# Patient Record
Sex: Female | Born: 1960 | Race: White | Hispanic: No | Marital: Single | Smoking: Never smoker
Health system: Southern US, Community
[De-identification: ages and names within clinical notes are randomized; demographics above are authoritative.]

## PROBLEM LIST (undated history)

## (undated) DIAGNOSIS — E0500 Thyrotoxicosis with diffuse goiter without thyrotoxic crisis or storm: Secondary | ICD-10-CM

## (undated) DIAGNOSIS — D497 Neoplasm of unspecified behavior of endocrine glands and other parts of nervous system: Secondary | ICD-10-CM

## (undated) DIAGNOSIS — D352 Benign neoplasm of pituitary gland: Secondary | ICD-10-CM

## (undated) DIAGNOSIS — G950 Syringomyelia and syringobulbia: Secondary | ICD-10-CM

## (undated) DIAGNOSIS — D72819 Decreased white blood cell count, unspecified: Secondary | ICD-10-CM

## (undated) DIAGNOSIS — K219 Gastro-esophageal reflux disease without esophagitis: Secondary | ICD-10-CM

## (undated) DIAGNOSIS — K589 Irritable bowel syndrome without diarrhea: Secondary | ICD-10-CM

## (undated) DIAGNOSIS — E079 Disorder of thyroid, unspecified: Secondary | ICD-10-CM

## (undated) DIAGNOSIS — M4316 Spondylolisthesis, lumbar region: Secondary | ICD-10-CM

## (undated) DIAGNOSIS — M1990 Unspecified osteoarthritis, unspecified site: Secondary | ICD-10-CM

## (undated) DIAGNOSIS — T7840XA Allergy, unspecified, initial encounter: Secondary | ICD-10-CM

## (undated) DIAGNOSIS — E785 Hyperlipidemia, unspecified: Secondary | ICD-10-CM

## (undated) DIAGNOSIS — L639 Alopecia areata, unspecified: Secondary | ICD-10-CM

## (undated) DIAGNOSIS — N946 Dysmenorrhea, unspecified: Secondary | ICD-10-CM

## (undated) HISTORY — DX: Unspecified osteoarthritis, unspecified site: M19.90

## (undated) HISTORY — DX: Irritable bowel syndrome, unspecified: K58.9

## (undated) HISTORY — DX: Allergy, unspecified, initial encounter: T78.40XA

## (undated) HISTORY — DX: Thyrotoxicosis with diffuse goiter without thyrotoxic crisis or storm: E05.00

## (undated) HISTORY — DX: Alopecia areata, unspecified: L63.9

## (undated) HISTORY — DX: Benign neoplasm of pituitary gland: D35.2

## (undated) HISTORY — DX: Decreased white blood cell count, unspecified: D72.819

## (undated) HISTORY — DX: Syringomyelia and syringobulbia: G95.0

## (undated) HISTORY — DX: Hyperlipidemia, unspecified: E78.5

## (undated) HISTORY — PX: POLYPECTOMY: SHX149

## (undated) HISTORY — DX: Gastro-esophageal reflux disease without esophagitis: K21.9

## (undated) HISTORY — DX: Neoplasm of unspecified behavior of endocrine glands and other parts of nervous system: D49.7

## (undated) HISTORY — DX: Spondylolisthesis, lumbar region: M43.16

## (undated) HISTORY — DX: Dysmenorrhea, unspecified: N94.6

## (undated) HISTORY — DX: Disorder of thyroid, unspecified: E07.9

---
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
## 1988-06-29 DIAGNOSIS — E0500 Thyrotoxicosis with diffuse goiter without thyrotoxic crisis or storm: Secondary | ICD-10-CM

## 1988-06-29 HISTORY — DX: Thyrotoxicosis with diffuse goiter without thyrotoxic crisis or storm: E05.00

## 1997-12-11 ENCOUNTER — Encounter: Admission: RE | Admit: 1997-12-11 | Discharge: 1998-03-11 | Payer: Self-pay | Admitting: Family Medicine

## 2000-01-26 ENCOUNTER — Other Ambulatory Visit: Admission: RE | Admit: 2000-01-26 | Discharge: 2000-01-26 | Payer: Self-pay | Admitting: *Deleted

## 2000-03-19 ENCOUNTER — Encounter: Payer: Self-pay | Admitting: *Deleted

## 2000-03-19 ENCOUNTER — Encounter: Admission: RE | Admit: 2000-03-19 | Discharge: 2000-03-19 | Payer: Self-pay | Admitting: *Deleted

## 2001-01-17 ENCOUNTER — Other Ambulatory Visit: Admission: RE | Admit: 2001-01-17 | Discharge: 2001-01-17 | Payer: Self-pay | Admitting: *Deleted

## 2001-06-29 DIAGNOSIS — G950 Syringomyelia and syringobulbia: Secondary | ICD-10-CM

## 2001-06-29 HISTORY — DX: Syringomyelia and syringobulbia: G95.0

## 2002-01-10 ENCOUNTER — Encounter: Admission: RE | Admit: 2002-01-10 | Discharge: 2002-01-10 | Payer: Self-pay | Admitting: *Deleted

## 2002-01-10 ENCOUNTER — Encounter: Payer: Self-pay | Admitting: *Deleted

## 2002-08-31 ENCOUNTER — Encounter: Admission: RE | Admit: 2002-08-31 | Discharge: 2002-11-22 | Payer: Self-pay | Admitting: Neurosurgery

## 2002-09-06 ENCOUNTER — Other Ambulatory Visit: Admission: RE | Admit: 2002-09-06 | Discharge: 2002-09-06 | Payer: Self-pay | Admitting: *Deleted

## 2003-01-17 ENCOUNTER — Encounter: Admission: RE | Admit: 2003-01-17 | Discharge: 2003-01-17 | Payer: Self-pay | Admitting: *Deleted

## 2003-01-17 ENCOUNTER — Encounter: Payer: Self-pay | Admitting: *Deleted

## 2003-12-05 ENCOUNTER — Other Ambulatory Visit: Admission: RE | Admit: 2003-12-05 | Discharge: 2003-12-05 | Payer: Self-pay | Admitting: *Deleted

## 2004-08-08 ENCOUNTER — Ambulatory Visit (HOSPITAL_COMMUNITY): Admission: RE | Admit: 2004-08-08 | Discharge: 2004-08-08 | Payer: Self-pay | Admitting: *Deleted

## 2004-12-19 ENCOUNTER — Other Ambulatory Visit: Admission: RE | Admit: 2004-12-19 | Discharge: 2004-12-19 | Payer: Self-pay | Admitting: *Deleted

## 2005-12-24 ENCOUNTER — Other Ambulatory Visit: Admission: RE | Admit: 2005-12-24 | Discharge: 2005-12-24 | Payer: Self-pay | Admitting: Obstetrics & Gynecology

## 2006-12-29 ENCOUNTER — Other Ambulatory Visit: Admission: RE | Admit: 2006-12-29 | Discharge: 2006-12-29 | Payer: Self-pay | Admitting: Obstetrics & Gynecology

## 2008-01-12 ENCOUNTER — Other Ambulatory Visit: Admission: RE | Admit: 2008-01-12 | Discharge: 2008-01-12 | Payer: Self-pay | Admitting: Obstetrics & Gynecology

## 2008-01-26 ENCOUNTER — Encounter: Admission: RE | Admit: 2008-01-26 | Discharge: 2008-01-26 | Payer: Self-pay | Admitting: Neurosurgery

## 2009-03-31 LAB — HM PAP SMEAR: HM Pap smear: NORMAL

## 2009-11-12 ENCOUNTER — Encounter: Admission: RE | Admit: 2009-11-12 | Discharge: 2010-01-01 | Payer: Self-pay | Admitting: Family Medicine

## 2010-07-20 ENCOUNTER — Encounter: Payer: Self-pay | Admitting: *Deleted

## 2010-11-28 HISTORY — PX: COLONOSCOPY: SHX174

## 2010-12-12 LAB — HM COLONOSCOPY

## 2010-12-17 ENCOUNTER — Ambulatory Visit (AMBULATORY_SURGERY_CENTER): Payer: BC Managed Care – PPO | Admitting: *Deleted

## 2010-12-17 VITALS — Ht 66.5 in | Wt 141.7 lb

## 2010-12-17 DIAGNOSIS — Z1211 Encounter for screening for malignant neoplasm of colon: Secondary | ICD-10-CM

## 2010-12-17 MED ORDER — PEG-KCL-NACL-NASULF-NA ASC-C 100 G PO SOLR: 1.0000 | Freq: Once | ORAL | Status: DC

## 2010-12-17 NOTE — Patient Instructions (Signed)
Please pick up prep at [facility] Please review discharge instructions

## 2010-12-18 ENCOUNTER — Encounter: Payer: Self-pay | Admitting: Internal Medicine

## 2010-12-22 ENCOUNTER — Encounter: Payer: Self-pay | Admitting: Internal Medicine

## 2010-12-22 ENCOUNTER — Ambulatory Visit (AMBULATORY_SURGERY_CENTER): Payer: BC Managed Care – PPO | Admitting: Internal Medicine

## 2010-12-22 VITALS — BP 146/65 | HR 85 | Temp 98.5°F | Resp 13 | Ht 66.5 in | Wt 140.0 lb

## 2010-12-22 DIAGNOSIS — D126 Benign neoplasm of colon, unspecified: Secondary | ICD-10-CM

## 2010-12-22 DIAGNOSIS — Z1211 Encounter for screening for malignant neoplasm of colon: Secondary | ICD-10-CM

## 2010-12-22 MED ORDER — SODIUM CHLORIDE 0.9 % IV SOLN: 500.0000 mL | INTRAVENOUS | Status: DC

## 2010-12-22 NOTE — Patient Instructions (Signed)
Please refer to blue and green discharge instruction sheets. Be very careful with your safety today due to meds given to you for sedation.

## 2010-12-23 ENCOUNTER — Telehealth: Payer: Self-pay | Admitting: *Deleted

## 2010-12-23 NOTE — Telephone Encounter (Signed)
No answer, no ID on answering machine, no message left.

## 2012-09-27 ENCOUNTER — Telehealth: Payer: Self-pay | Admitting: Obstetrics & Gynecology

## 2012-09-27 NOTE — Telephone Encounter (Signed)
Pt was calling in regards to an issue she is having. She thinks she has a tear in her vaginal area. Going to the bathroom causes a burning sensation. Please call the patient and advise what to do.

## 2012-09-27 NOTE — Telephone Encounter (Signed)
Spoke with pt about small tear. She thinks she may have scratched the area accidentally. With mirror and magnifying glass she can see a small red tear. Reports it burns a little when she urinates, but it does not itch. Advised pt to try Aveeno sitz bath, and to try pouring water over the area after urinating to decrease burning. Pt agreeable and will call back if problem persists.  aa

## 2012-09-27 NOTE — Telephone Encounter (Signed)
LMTCB  aa 

## 2012-12-23 ENCOUNTER — Telehealth: Payer: Self-pay | Admitting: Obstetrics & Gynecology

## 2012-12-23 NOTE — Telephone Encounter (Signed)
Patient in menopause and is bleeding. Patient would like to talk to a nurse.

## 2012-12-23 NOTE — Telephone Encounter (Signed)
Spoke with patient, states had bright red bleeding x once with wiping this morning, then for the next 2-3 hours very light pink with wiping. It has completely stopped now. Appointment made for 12/26/12 with Dr Hyacinth Meeker. Patient aware if bleeding starts again and becomes heavy over the weekend to call our office to get the number for the on call doctor.

## 2012-12-23 NOTE — Telephone Encounter (Signed)
Routed to [employer] to call patient per [employer]

## 2012-12-23 NOTE — Telephone Encounter (Signed)
Needs OV.  

## 2012-12-23 NOTE — Telephone Encounter (Signed)
Pt is concerned that she had some bleeding this morning (bright red) has not had a cycle since Oct or Nov 2012. Wearing a panty liner, has been to the bathroom three times this morning bleeding is now light pink. No new medications, had an appointment yesterday with her PCP(Dr. Tisovec) having horrible headaches was Told to stop taking advil for headaches and to cut back on caffeine. Please advise

## 2012-12-26 ENCOUNTER — Ambulatory Visit (INDEPENDENT_AMBULATORY_CARE_PROVIDER_SITE_OTHER): Payer: BC Managed Care – PPO | Admitting: Obstetrics & Gynecology

## 2012-12-26 VITALS — BP 100/78 | HR 78 | Resp 14 | Ht 66.5 in | Wt 148.0 lb

## 2012-12-26 DIAGNOSIS — N951 Menopausal and female climacteric states: Secondary | ICD-10-CM

## 2012-12-26 DIAGNOSIS — N950 Postmenopausal bleeding: Secondary | ICD-10-CM

## 2012-12-26 DIAGNOSIS — R51 Headache: Secondary | ICD-10-CM

## 2012-12-26 DIAGNOSIS — R519 Headache, unspecified: Secondary | ICD-10-CM

## 2012-12-26 LAB — PROLACTIN: Prolactin: <0.3 ng/mL

## 2012-12-26 LAB — THYROID PANEL WITH TSH
Free Thyroxine Index: 3.1 (ref 1.0–3.9)
T3 Uptake: 34.6 % (ref 22.5–37.0)
T4, Total: 9 ug/dL (ref 5.0–12.5)
TSH: 2.157 u[IU]/mL (ref 0.350–4.500)

## 2012-12-26 NOTE — Patient Instructions (Signed)

## 2012-12-26 NOTE — Progress Notes (Signed)
Subjective:     Patient ID: Darlene Walker, female   DOB: [birth year], 52 y.o.   MRN: [id number]  HPI 52 year old DWF here with PMP bleeding.  Bleeding was Friday, bright red and she noted when she went to void.  Wore a minipad and within 24 hours was gone.  No cramping or breast tenderness before.  No PMS symptoms.    Reports she has seen PCP, Dr. Wylene Walker, several times due to severe headaches.  Several cups of coffee do help.  She is taking Advil several times a day.  Drinking a lot of water.  Nothing has been prescribed.  Also noting a little dizziness.  This is most prominent when she bends over and lifts up head.  No trouble walking.  Has had some neck tension.  She is back in the office and back in front of the computer.    Also noticing increased bruising on legs.    Review of Systems  All other systems reviewed and are negative.      Objective:   Physical Exam  Constitutional: She is oriented to person, place, and time. She appears well-developed and well-nourished.  HENT:  Head: Normocephalic and atraumatic.  Neck: Normal range of motion. Neck supple.  Abdominal: Soft. Bowel sounds are normal.  Genitourinary: Vagina normal and uterus normal.  Neurological: She is alert and oriented to person, place, and time.  Skin: Skin is warm and dry.  Psychiatric: She has a normal mood and affect.      Endometrial biopsy recommended.  Discussed with patient.  Verbal and written consent obtained.   Procedure:  Speculum placed.  Cervix visualized and cleansed with betadine prep.  A single toothed tenaculum was applied to the anterior lip of the cervix.  Milex dilator was used ot dilate endocervical canal.  Endometrial pipelle was advanced through the cervix into the endometrial cavity without difficulty.  Pipelle passed to7cm.  Suction applied and pipelle removed with good tissue sample obtained.  Tenculum removed.  No bleeding noted.  Patient tolerated procedure well.  Assessment:      Postmenopausal bleeding     Plan:     Endometrial biopsy pending TSH, Prolactin If neg, will consider MRI

## 2012-12-27 NOTE — Progress Notes (Signed)
She needs a pelvic and breast exam yearly.  She can have Dr. Wylene Simmer do this one year and me the next.  Can you look and see when her last one was and schedule out one year from last one.

## 2013-01-02 ENCOUNTER — Telehealth: Payer: Self-pay | Admitting: Obstetrics & Gynecology

## 2013-01-02 NOTE — Telephone Encounter (Signed)
Return call from patient.  She states her bottle has refills until 11/2012.  Advised pt that she was given RX for one year on 03/31/12.  She will call her pharmacy to see if they have another RX on file.  She will call us back tomorrow to let us know.

## 2013-01-02 NOTE — Telephone Encounter (Signed)
Refill request for PRISTIQ Last filled by MD on - 03/31/12 X 1 YEAR Last AEX - 03/31/12 Message left on [phone] for patient to return call.

## 2013-01-02 NOTE — Telephone Encounter (Signed)
Patient needs her Pristiq called in for refill  Runs out on Wednesday Patient is at [facility]  [address]  [place]  [phone]

## 2013-01-04 NOTE — Telephone Encounter (Signed)
Pt called back to let Judeth Cornfield know she called the pharmacy and she never turned in the prescription. Pt wants to get enough pills (5) and she will look for the prescription when she gets back from [facility]. Pharmacy: [facility] [phone]

## 2013-01-04 NOTE — Telephone Encounter (Signed)
Please advise if refill OK.  Thank you. 

## 2013-01-04 NOTE — Telephone Encounter (Signed)
Chart please 

## 2013-01-04 NOTE — Telephone Encounter (Signed)
Chart on your counter.

## 2013-01-05 ENCOUNTER — Telehealth: Payer: Self-pay | Admitting: Orthopedic Surgery

## 2013-01-05 MED ORDER — DESVENLAFAXINE SUCCINATE ER 50 MG PO TB24: 50.0000 mg | ORAL_TABLET | Freq: Every day | ORAL | Status: DC

## 2013-01-05 NOTE — Telephone Encounter (Signed)
Spoke with pt who is currently at [facility]. She saw SM last week, but forgot to mention refilling Pristiq while she was here. Her last refill was June 2014. [facility] [address] in [place] is nearby. [phone]. Entire year's refill can be sent, as pt uses [facility] at home too. Chart on your desk.

## 2013-01-05 NOTE — Telephone Encounter (Signed)
Done.  RX already done.

## 2013-01-11 ENCOUNTER — Encounter: Payer: Self-pay | Admitting: *Deleted

## 2013-01-11 NOTE — Telephone Encounter (Signed)
Patient needs to give info to a nurse. Patient said she tried to sent a message through MyChart and got a message stating no one is available to receive the message.

## 2013-01-11 NOTE — Telephone Encounter (Signed)
Left message on CB# of need to return call.

## 2013-01-11 NOTE — Telephone Encounter (Signed)
Patient stated tried to send message to Dr. Hyacinth Meeker from My Chart but would not let her send. Last AEX 03/31/2012 paper chart [facility] 12/26/2012 Patient states she is back from her vacation now . States she had no headaches while gone on vacation. She is now back at work and headaches have come back. Patient calling also to let Dr.Miller know of this morning of Vaginal bleeding noted when wipe with first voided urine this morning. States that she is not 100% sure that this was not rectal bleeding. Stated she did not have a bowel movement this morning when saw a small amount of light pink when wiped. Patient states is wearing a mini pad and has had no bleeding so far butstated again  could have been rectal. Patient states she is OK of waiting to here your response when you get back to office but will call back if any more symptoms or problems noted.

## 2013-01-16 NOTE — Telephone Encounter (Signed)
She had an endometrial biopsy which was negative so unless bleeding continues, it is okay to watch.  If has another episode, she needs to call.    She reports a lot of stress at work.  I felt if the headaches went away on vacation, an MRI is not necessary.  Does she want a referral to [facility]?  These seem very work related.

## 2013-01-16 NOTE — Telephone Encounter (Signed)
Call to patient and notified of Dr Garen Lah response.  Patient to montior bleeding and call back if persists or returns.  States she has noticed that HA seem very positional, worse when up but better when bends over, or when reclined in dental chair.  (reports this was so pronounced at dentist that they actually sat her up and reclined her several times to see how HA responded) Dentist said ? Fluid.  Briefly discussed she could try decongestant for this and see if any improvement, if this is something she can already take and tolerate.  She also reports increase in hot flashes.  She is aware that stress may be playing a role in HA but with increased hot flashes, she feels this is hormone related.  Wants to increase Pristiq back to whole pill.  She states she cut pill in half herself and now would like to go back up, is this OK?  She will plan the higher dose this evening at [facility] unless she hears from [place]. She will wait to see how HA do after her very busy week at work and then decide about referral to [facility].  Please advise on Pristiq.

## 2013-05-04 ENCOUNTER — Other Ambulatory Visit: Payer: Self-pay

## 2013-06-15 LAB — HM MAMMOGRAPHY: HM Mammogram: NORMAL

## 2013-07-27 ENCOUNTER — Encounter: Payer: Self-pay | Admitting: Obstetrics & Gynecology

## 2013-07-28 ENCOUNTER — Ambulatory Visit: Payer: BC Managed Care – PPO | Admitting: Obstetrics & Gynecology

## 2013-07-30 HISTORY — PX: BUNIONECTOMY: SHX129

## 2013-07-31 ENCOUNTER — Encounter: Payer: Self-pay | Admitting: Obstetrics & Gynecology

## 2013-07-31 ENCOUNTER — Ambulatory Visit (INDEPENDENT_AMBULATORY_CARE_PROVIDER_SITE_OTHER): Payer: BC Managed Care – PPO | Admitting: Obstetrics & Gynecology

## 2013-07-31 VITALS — BP 88/58 | HR 68 | Resp 16 | Ht 66.5 in | Wt 152.4 lb

## 2013-07-31 DIAGNOSIS — Z01419 Encounter for gynecological examination (general) (routine) without abnormal findings: Secondary | ICD-10-CM

## 2013-07-31 DIAGNOSIS — D353 Benign neoplasm of craniopharyngeal duct: Secondary | ICD-10-CM

## 2013-07-31 DIAGNOSIS — D352 Benign neoplasm of pituitary gland: Secondary | ICD-10-CM | POA: Insufficient documentation

## 2013-07-31 DIAGNOSIS — Z0184 Encounter for antibody response examination: Secondary | ICD-10-CM

## 2013-07-31 MED ORDER — DESVENLAFAXINE SUCCINATE ER 50 MG PO TB24: 50.0000 mg | ORAL_TABLET | Freq: Every day | ORAL | Status: DC

## 2013-07-31 MED ORDER — GUAIFENESIN-CODEINE 100-10 MG/5ML PO SOLN
ORAL | Status: DC
Start: 2013-07-31 — End: 2014-08-16

## 2013-07-31 NOTE — Progress Notes (Signed)
53 y.o. G0P0 SingleCaucasianF here for annual exam.  Reports starting to have a URI.  Having a lot of congestion and head pressure. Pt in multiple classrooms.  Wonders about Measles immunity.  No vaginal bleeding  Patient's last menstrual period was 03/30/2011.          Sexually active: yes  The current method of family planning is none.    Exercising: no  not regularly Smoker:  no  Health Maintenance: Pap:  03/31/12 WNL/negative HR HPV History of abnormal Pap:  no MMG:  06/15/13 3D normal Colonoscopy:  7/12 repeat in 5 years (Dr. Henrene Pastor) BMD:   2010 scheduled for 9/15 TDaP:  7/08 Screening Labs: PCP, Hb today: PCP, Urine today: PCP   reports that she has never smoked. She has never used smokeless tobacco. She reports that she drinks about 0.5 ounces of alcohol per week. She reports that she does not use illicit drugs.  Past Medical History  Diagnosis Date  . Hyperlipidemia     with High Small Particle LDL  . Allergy   . Arthritis   . Thyroid disease     hx of, no meds now  . IBS (irritable bowel syndrome)   . Graves' disease 1990  . Dysmenorrhea   . Syringomyelia 2003  . Microprolactinoma     Past Surgical History  Procedure Laterality Date  . Tonsillectomy and adenoidectomy  1968  . Colonoscopy  11/2010    Current Outpatient Prescriptions  Medication Sig Dispense Refill  . aspirin 81 MG tablet Take 81 mg by mouth daily.        . cabergoline (DOSTINEX) 0.5 MG tablet 2 (two) times a week.       . desvenlafaxine (PRISTIQ) 50 MG 24 hr tablet Take 1 tablet (50 mg total) by mouth daily.  30 tablet  13  . Loratadine (CLARITIN PO) Take 1 tablet by mouth daily.        . simvastatin (ZOCOR) 40 MG tablet 1 tablet Daily.       Current Facility-Administered Medications  Medication Dose Route Frequency Provider Last Rate Last Dose  . 0.9 %  sodium chloride infusion  500 mL Intravenous Continuous Irene Shipper, MD        Family History  Problem Relation Age of Onset  . Colon  cancer Neg Hx   . Heart disease Mother   . Hypertension Father   . Heart disease Father     ROS:  Pertinent items are noted in HPI.  Otherwise, a comprehensive ROS was negative.  Exam:   BP 88/58  Pulse 68  Resp 16  Ht 5' 6.5" (1.689 m)  Wt 152 lb 6.4 oz (69.128 kg)  BMI 24.23 kg/m2  LMP 03/30/2011  Weight change: +7lbs   Height: 5' 6.5" (168.9 cm)  Ht Readings from Last 3 Encounters:  07/31/13 5' 6.5" (1.689 m)  12/26/12 5' 6.5" (1.689 m)  12/22/10 5' 6.5" (1.689 m)    General appearance: alert, cooperative and appears stated age Head: Normocephalic, without obvious abnormality, atraumatic Neck: no adenopathy, supple, symmetrical, trachea midline and thyroid normal to inspection and palpation Lungs: clear to auscultation bilaterally Breasts: normal appearance, no masses or tenderness Heart: regular rate and rhythm Abdomen: soft, non-tender; bowel sounds normal; no masses,  no organomegaly Extremities: extremities normal, atraumatic, no cyanosis or edema Skin: Skin color, texture, turgor normal. No rashes or lesions Lymph nodes: Cervical, supraclavicular, and axillary nodes normal. No abnormal inguinal nodes palpated Neurologic: Grossly normal   Pelvic:  External genitalia:  no lesions              Urethra:  normal appearing urethra with no masses, tenderness or lesions              Bartholins and Skenes: normal                 Vagina: normal appearing vagina with normal color and discharge, no lesions              Cervix: no lesions              Pap taken: no Bimanual Exam:  Uterus:  normal size, contour, position, consistency, mobility, non-tender              Adnexa: normal adnexa               Rectovaginal: Confirms               Anus:  normal sphincter tone, no lesions  A:  Well Woman with normal exam PMP, no HRT H/O microprolactinoma H/O Grave's Dx H/O colon polyps URI  P:   Mammogram yearly.  D/w pt Shirlee Limerick C dense breast. Dr. Osborne Casco does labs yearly  including yearly prolactin pap smear with neg HR HPV 10/13 RF prestiq 50mg .  Pt is going to stop cutting it in half and see how this works.   Rx for Rubitussin AC 1-2 tsp qhs prn cough given. return annually or prn  An After Visit Summary was printed and given to the patient.

## 2013-07-31 NOTE — Patient Instructions (Signed)

## 2013-08-01 NOTE — Telephone Encounter (Signed)
Order placed for antibody.  Pt to call when wants labs.

## 2013-09-05 ENCOUNTER — Telehealth: Payer: Self-pay

## 2013-09-05 NOTE — Telephone Encounter (Signed)
Message copied by Robley Fries on Tue Sep 05, 2013  9:41 AM ------      Message from: Megan Salon      Created: Sun Sep 03, 2013  2:33 PM      Regarding: lab test       Pt wanted testing for measles antibodies.  Was sick right after AEX and so was going to come in later.  Hasn't come yet.  Please call and see if still desires testing.  If not, I will cancel lab order.  Thanks.            MSM ------

## 2013-09-05 NOTE — Telephone Encounter (Signed)
Patient appointment made for labwork//kn

## 2013-09-05 NOTE — Telephone Encounter (Signed)
Lmtcb//kn 

## 2013-09-07 ENCOUNTER — Other Ambulatory Visit (INDEPENDENT_AMBULATORY_CARE_PROVIDER_SITE_OTHER): Payer: BC Managed Care – PPO

## 2013-09-07 DIAGNOSIS — Z0184 Encounter for antibody response examination: Secondary | ICD-10-CM

## 2013-09-08 LAB — RUBEOLA ANTIBODY IGG: Rubeola IgG: 134 AU/mL — ABNORMAL HIGH (ref <25.00)

## 2014-02-25 ENCOUNTER — Encounter: Payer: Self-pay | Admitting: Obstetrics & Gynecology

## 2014-08-16 ENCOUNTER — Encounter: Payer: Self-pay | Admitting: Obstetrics & Gynecology

## 2014-08-16 ENCOUNTER — Ambulatory Visit (INDEPENDENT_AMBULATORY_CARE_PROVIDER_SITE_OTHER): Payer: BC Managed Care – PPO | Admitting: Obstetrics & Gynecology

## 2014-08-16 VITALS — BP 118/60 | HR 64 | Resp 16 | Ht 66.25 in | Wt 156.4 lb

## 2014-08-16 DIAGNOSIS — Z124 Encounter for screening for malignant neoplasm of cervix: Secondary | ICD-10-CM

## 2014-08-16 DIAGNOSIS — Z01419 Encounter for gynecological examination (general) (routine) without abnormal findings: Secondary | ICD-10-CM

## 2014-08-16 DIAGNOSIS — N393 Stress incontinence (female) (male): Secondary | ICD-10-CM

## 2014-08-16 NOTE — Progress Notes (Signed)
54 y.o. G0P0 SingleCaucasian here for annual exam.  No vaginal bleeding.  Had bunion surgery.  Wearing a boot still.  Surgery was 07/09/14.  Feels like she is doing well.  Reports she is having the typical urinary leakage when she coughs or laughs.  Had a bad GI issue two weeks ago.  Threw up quite a bit.  When this happened, she was having urinary leakage.  This stopped as soon as the emesis stopped.   Patient's last menstrual period was 03/30/2011.          Sexually active: Yes.    The current method of family planning is none.    Exercising: No.  The patient does not participate in regular exercise at present. not regularly Smoker:  no  Health Maintenance: Pap: 03/31/12 WNL/negative HR HPV History of abnormal Pap: no MMG: 07/05/14 3D normal Colonoscopy: 7/12 repeat in 5 years (Dr. Henrene Pastor) BMD: 10/15 at Dr. Loren Racer office TDaP: 7/08 Screening Labs: PCP, Hb today: PCP, Urine today: PCP   reports that she has never smoked. She has never used smokeless tobacco. She reports that she drinks alcohol. She reports that she does not use illicit drugs.  Past Medical History  Diagnosis Date  . Hyperlipidemia     with High Small Particle LDL  . Allergy   . Arthritis   . Thyroid disease     hx of, no meds now  . IBS (irritable bowel syndrome)   . Graves' disease 1990  . Dysmenorrhea   . Syringomyelia 2003  . Microprolactinoma     Past Surgical History  Procedure Laterality Date  . Tonsillectomy and adenoidectomy  1968  . Colonoscopy  11/2010  . Bunionectomy      left foot    Current Outpatient Prescriptions  Medication Sig Dispense Refill  . aspirin 81 MG tablet Take 81 mg by mouth daily.      . cabergoline (DOSTINEX) 0.5 MG tablet 2 (two) times a week.     . desvenlafaxine (PRISTIQ) 50 MG 24 hr tablet Take 1 tablet (50 mg total) by mouth daily. 30 tablet 13  . simvastatin (ZOCOR) 40 MG tablet 1 tablet Daily.     Current Facility-Administered Medications  Medication Dose  Route Frequency Provider Last Rate Last Dose  . 0.9 %  sodium chloride infusion  500 mL Intravenous Continuous Irene Shipper, MD        Family History  Problem Relation Age of Onset  . Colon cancer Neg Hx   . Heart disease Mother   . Hypertension Father   . Heart disease Father     ROS:  Pertinent items are noted in HPI.  Otherwise, a comprehensive ROS was negative.  Exam:   General appearance: alert, cooperative and appears stated age Head: Normocephalic, without obvious abnormality, atraumatic Neck: no adenopathy, supple, symmetrical, trachea midline and thyroid normal to inspection and palpation Lungs: clear to auscultation bilaterally Breasts: normal appearance, no masses or tenderness Heart: regular rate and rhythm Abdomen: soft, non-tender; bowel sounds normal; no masses,  no organomegaly Extremities: extremities normal, atraumatic, no cyanosis or edema Skin: Skin color, texture, turgor normal. No rashes or lesions Lymph nodes: Cervical, supraclavicular, and axillary nodes normal. No abnormal inguinal nodes palpated Neurologic: Grossly normal   Pelvic: External genitalia:  no lesions              Urethra:  normal appearing urethra with no masses, tenderness or lesions  Bartholins and Skenes: normal                 Vagina: normal appearing vagina with normal color and discharge, no lesions              Cervix: no lesions              Pap taken: Yes.   Bimanual Exam:  Uterus:  normal size, contour, position, consistency, mobility, non-tender              Adnexa: normal adnexa and no mass, fullness, tenderness               Rectovaginal: Confirms               Anus:  normal sphincter tone, no lesions  Chaperone was present for exam.  A:  Well Woman with normal exam PMP, no HRT H/O microprolactinoma H/O Grave's Dx H/O colon polyps SUI  P: Mammogram yearly. Pt aware of increased breast density Referral to pelvic PT to see if this will help.  If  doesn't, will recommend additional evaluation Dr. Osborne Casco does labs yearly including yearly prolactin pap smear with neg HR HPV 10/13.  Pap today RF prestiq 50mg . Pt is cutting in 1/2 and will try to wean off further. return annually or prn

## 2014-08-17 ENCOUNTER — Encounter: Payer: Self-pay | Admitting: Obstetrics & Gynecology

## 2014-08-17 MED ORDER — DESVENLAFAXINE SUCCINATE ER 50 MG PO TB24: 50.0000 mg | ORAL_TABLET | Freq: Every day | ORAL | Status: DC

## 2014-08-21 LAB — IPS PAP TEST WITH REFLEX TO HPV

## 2014-08-22 ENCOUNTER — Encounter: Payer: Self-pay | Admitting: Obstetrics & Gynecology

## 2014-08-22 DIAGNOSIS — M25562 Pain in left knee: Secondary | ICD-10-CM

## 2014-08-27 MED ORDER — ESTRADIOL 0.1 MG/GM VA CREA
TOPICAL_CREAM | VAGINAL | Status: DC
Start: 2014-08-27 — End: 2015-05-09

## 2014-08-28 ENCOUNTER — Telehealth: Payer: Self-pay | Admitting: Emergency Medicine

## 2014-08-28 NOTE — Telephone Encounter (Signed)
Patient returned call. Appointment information given. She is agreeable and expresses thanks for referral. Routing to provider for final review. Patient agreeable to disposition. Will close encounter

## 2014-08-28 NOTE — Telephone Encounter (Signed)
Calling patient to advise of appointment with Dr. Lynann Bologna Thursday 3/3 at 0915 with check in at 0900.   [facility] [address] [place], [place] [address]  Phone: [phone]

## 2014-10-11 ENCOUNTER — Encounter: Payer: Self-pay | Admitting: Obstetrics & Gynecology

## 2014-10-11 MED ORDER — DESVENLAFAXINE SUCCINATE ER 50 MG PO TB24: 50.0000 mg | ORAL_TABLET | Freq: Every day | ORAL | Status: DC

## 2014-11-29 ENCOUNTER — Encounter: Payer: Self-pay | Admitting: Obstetrics & Gynecology

## 2014-12-03 ENCOUNTER — Telehealth: Payer: Self-pay | Admitting: Emergency Medicine

## 2014-12-03 NOTE — Telephone Encounter (Signed)
Dr. Sabra Heck can you review and advise, patient sent mychart message.

## 2014-12-03 NOTE — Telephone Encounter (Signed)
Chief Complaint  Patient presents with  . Advice Only    Patient sent mychart message to Dr. Sabra Heck      Dr. Sabra Heck-    Have you heard of [facility] in [place]? Dr. Launa Grill. It is a weight loss program that some friends have tried and have had great success. I am skeptical but want to try!       Here is some of what what the website says:   supervised holistic approach to weight loss Dr. Marianna Payment consults with each client to design an individualized weight loss plan, which includes a low-carb / low-calorie diet, lipotropic injections (vitamins and enzymes - no HCG hormone injections), and an appetite suppressant to help them reach their desired weight. Safety, affordability, convenience and results that last - that's what sets [place] apart.   Includes a health assessment and consultation with a Medical Provider to go over health history and design a program specifically for you, two Lipo-plex injections (to help aid in the breakdown of fat), and a written prescription for an appetite suppressant.      Any advice?    Darlene Walker

## 2014-12-03 NOTE — Telephone Encounter (Signed)
Telephone call for triage created to discuss message with patient and disposition as appropriate.   

## 2014-12-04 NOTE — Telephone Encounter (Signed)
I don't know any specifically about this program.  I am sorry.  Doesn't sound like there is anything that is detrimental and it is physician monitored but I just don't know enough.  Again, I'm sorry.

## 2014-12-05 NOTE — Telephone Encounter (Signed)
Call to patient and message from Dr. Sabra Heck given. Patient agreeable to message. She will call back if any further questions. Routing to provider for final review. Patient agreeable to disposition. Will close encounter.

## 2015-04-19 LAB — IFOBT (OCCULT BLOOD): IFOBT: POSITIVE

## 2015-05-09 ENCOUNTER — Encounter: Payer: Self-pay | Admitting: Physician Assistant

## 2015-05-09 ENCOUNTER — Ambulatory Visit (INDEPENDENT_AMBULATORY_CARE_PROVIDER_SITE_OTHER): Payer: BC Managed Care – PPO | Admitting: Physician Assistant

## 2015-05-09 VITALS — BP 108/72 | HR 80 | Ht 66.0 in | Wt 149.0 lb

## 2015-05-09 DIAGNOSIS — Z860101 Personal history of adenomatous and serrated colon polyps: Secondary | ICD-10-CM | POA: Insufficient documentation

## 2015-05-09 DIAGNOSIS — R195 Other fecal abnormalities: Secondary | ICD-10-CM

## 2015-05-09 DIAGNOSIS — Z86010 Personal history of colonic polyps: Secondary | ICD-10-CM | POA: Diagnosis not present

## 2015-05-09 DIAGNOSIS — D369 Benign neoplasm, unspecified site: Secondary | ICD-10-CM | POA: Diagnosis not present

## 2015-05-09 MED ORDER — PEG-KCL-NACL-NASULF-NA ASC-C 100 G PO SOLR: 1.0000 | Freq: Once | ORAL | Status: DC

## 2015-05-09 NOTE — Progress Notes (Signed)
Agree with assessment and plan 

## 2015-05-09 NOTE — Patient Instructions (Signed)
You have been scheduled for a colonoscopy. Please follow written instructions given to you at your visit today.  Please pick up your prep supplies at the pharmacy within the next 1-3 days. If you use inhalers (even only as needed), please bring them with you on the day of your procedure.   

## 2015-05-09 NOTE — Progress Notes (Signed)
Patient ID: Darlene Walker, female   DOB: 05-11-1961, 54 y.o.   MRN: [id number]   Subjective:    Patient ID: Darlene Walker, female    DOB: 01-14-1961, 54 y.o.   MRN: [id number]  HPI Darlene Walker is a pleasant 54 year old white female known to Dr. Henrene Walker from screening colonoscopy done in June 2012. She was found to have one small polyp which was removed. Path was consistent with a serrated adenoma and she was advised to have 5 year interval follow-up. She is referred today by Dr. Johann Walker back after routine physical was done and patient had a positive  Hemosure . She has no complaints of abdominal discomfort or changes in bowel habits melena or hematochezia. No upper GI symptoms. Family history negative for colon cancer.  Review of Systems Pertinent positive and negative review of systems were noted in the above HPI section.  All other review of systems was otherwise negative.  Outpatient Encounter Prescriptions as of 05/09/2015  Medication Sig  . aspirin 81 MG tablet Take 81 mg by mouth daily.    . cabergoline (DOSTINEX) 0.5 MG tablet 2 (two) times a week.   . desvenlafaxine (PRISTIQ) 50 MG 24 hr tablet Take 1 tablet (50 mg total) by mouth daily.  . simvastatin (ZOCOR) 40 MG tablet 1 tablet Daily.  . peg 3350 powder (MOVIPREP) 100 G SOLR Take 1 kit (200 g total) by mouth once.  . [DISCONTINUED] estradiol (ESTRACE) 0.1 MG/GM vaginal cream 1 gram vaginally twice weekly (Patient not taking: Reported on 05/09/2015)  . [DISCONTINUED] 0.9 %  sodium chloride infusion    No facility-administered encounter medications on file as of 05/09/2015.   No Known Allergies Patient Active Problem List   Diagnosis Date Noted  . Hx of adenomatous polyp of colon 05/09/2015  . 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 Marital Status: Single    Spouse Name: N/A  . Number of Children: 0  . Years of Education: N/A   Occupational History  .  [employer] History  Main Topics  . Smoking status: Never Smoker   . Smokeless tobacco: Never Used  . Alcohol Use: 0.0 oz/week    0 Standard drinks or equivalent per week  . Drug Use: No  . Sexual Activity:    Partners: Male   Other Topics Concern  . Not on file   Social History Narrative    Ms. Arneson's family history includes Heart disease in her father and mother; Hypertension in her father. There is no history of Colon cancer.      Objective:    Filed Vitals:   05/09/15 0836  BP: 108/72  Pulse: 80    Physical Exam    well-developed white female in no acute distress, pleasant blood pressure 108/72 pulse 80 height 5 foot 6 weight 149. HEENT; nontraumatic normocephalic EOMI PERRLA sclera anicteric, Cardiovascular; regular rate and rhythm with S1-S2 no murmur or gallop, Pulmonary ;clear bilaterally, Abdomen; soft nontender nondistended bowel sounds are active there is no palpable mass or hepatosplenomegaly, Rectal; exam not done,Ext;no clubbing cyanosis or edema skin warm and dry, Neuropsych; mood and affect appropriate       Assessment & Plan:   #1 54 yo female with hx of Adenomatous colon polyp 11/2010 presenting with positive Hemosure. R/O occult colon lesion  Plan; Pt will be scheduled for Colonoscopy with Dr. Henrene Walker -procedure discussed in detail with pt and she is agreeable to proceed.  Darlene Bowen S Shadi Larner PA-C 05/09/2015   Cc: Darlene Freeze, MD

## 2015-05-29 ENCOUNTER — Ambulatory Visit (AMBULATORY_SURGERY_CENTER): Payer: BC Managed Care – PPO | Admitting: Internal Medicine

## 2015-05-29 ENCOUNTER — Encounter: Payer: Self-pay | Admitting: Internal Medicine

## 2015-05-29 VITALS — BP 98/59 | HR 119 | Temp 97.5°F | Resp 19 | Ht 66.0 in | Wt 149.0 lb

## 2015-05-29 DIAGNOSIS — D122 Benign neoplasm of ascending colon: Secondary | ICD-10-CM

## 2015-05-29 DIAGNOSIS — Z86010 Personal history of colonic polyps: Secondary | ICD-10-CM | POA: Diagnosis not present

## 2015-05-29 DIAGNOSIS — R195 Other fecal abnormalities: Secondary | ICD-10-CM

## 2015-05-29 MED ORDER — SODIUM CHLORIDE 0.9 % IV SOLN: 500.0000 mL | INTRAVENOUS | Status: DC

## 2015-05-29 NOTE — Progress Notes (Signed)
Called to room to assist during endoscopic procedure.  Patient ID and intended procedure confirmed with present staff. Received instructions for my participation in the procedure from the performing physician.  

## 2015-05-29 NOTE — Progress Notes (Signed)
Patient awakening,vss,report to rn 

## 2015-05-29 NOTE — Patient Instructions (Signed)

## 2015-05-29 NOTE — Op Note (Signed)
[place]  [address]. [place], [id number]   COLONOSCOPY PROCEDURE REPORT  PATIENT: Darlene Walker, Darlene Walker  MR#: [id number] BIRTHDATE: Dec 09, 1960 , 19  yrs. old GENDER: female ENDOSCOPIST: Eustace Quail, MD REFERRED [id number] Tisovec, M.D. PROCEDURE DATE:  05/29/2015 PROCEDURE:   Colonoscopy with snare polypectomy x 1  ASA CLASS:   Class II INDICATIONS:heme-positive stool. .index colonoscopy June 2012 with sessile serrated polyp MEDICATIONS: Propofol 400 mg IV and Monitored anesthesia care  DESCRIPTION OF PROCEDURE:   After the risks benefits and alternatives of the procedure were thoroughly explained, informed consent was obtained.  The digital rectal exam revealed no abnormalities of the rectum.   The LB [id number]  endoscope was introduced through the anus and advanced to the cecum, which was identified by both the appendix and ileocecal valve. No adverse events experienced.   The quality of the prep was excellent. (MoviPrep was used)  The instrument was then slowly withdrawn as the colon was fully examined. Estimated blood loss is zero unless otherwise noted in this procedure report.      COLON FINDINGS: A sessile polyp measuring 5 mm in size was found in the ascending colon.  A polypectomy was performed with a cold snare.  The resection was complete, the polyp tissue was completely retrieved and sent to histology.   The examination was otherwise normal.  Retroflexed views revealed no abnormalities. The time to cecum = 3.7 Withdrawal time = 10.7   The scope was withdrawn and the procedure completed. COMPLICATIONS: There were no immediate complications.  ENDOSCOPIC IMPRESSION: 1.   Sessile polyp was found in the ascending colon; polypectomy was performed with a cold snare 2.   The examination was otherwise normal  RECOMMENDATIONS: 1. Follow up colonoscopy in 5 years 2. I do not recommend annual Hemoccult studies for colon cancer screening  as this patient is in active surveillance program  eSigned:  Eustace Quail, MD 05/29/2015 2:08 PM   cc: The Patient and Domenick Gong, MD

## 2015-05-30 ENCOUNTER — Telehealth: Payer: Self-pay | Admitting: *Deleted

## 2015-05-30 NOTE — Telephone Encounter (Signed)
  Follow up Call-  Call back number 05/29/2015  Post procedure Call Back phone  # [phone]  Permission to leave phone message Yes     Patient questions:  Do you have a fever, pain , or abdominal swelling? No. Pain Score  0   Have you tolerated food without any problems? Yes.    Have you been able to return to your normal activities? Yes.    Do you have any questions about your discharge instructions: Diet   No. Medications  No. Follow up visit  No.  Do you have questions or concerns about your Care? No.  Actions: * If pain score is 4 or above: No action needed, pain <4.

## 2015-06-04 ENCOUNTER — Encounter: Payer: Self-pay | Admitting: Internal Medicine

## 2015-10-18 ENCOUNTER — Ambulatory Visit: Payer: BC Managed Care – PPO | Admitting: Obstetrics & Gynecology

## 2015-10-21 ENCOUNTER — Other Ambulatory Visit: Payer: Self-pay | Admitting: Obstetrics & Gynecology

## 2015-10-21 NOTE — Telephone Encounter (Signed)
Medication refill request: Pristiq  Last AEX:  08-16-14 Next AEX: 12-02-15 Last MMG (if hormonal medication request): 08-30-15 WNl Refill authorized: please advise   Sending to BS since SM is out of the office

## 2015-12-02 ENCOUNTER — Encounter: Payer: Self-pay | Admitting: Obstetrics & Gynecology

## 2015-12-02 ENCOUNTER — Ambulatory Visit (INDEPENDENT_AMBULATORY_CARE_PROVIDER_SITE_OTHER): Payer: BC Managed Care – PPO | Admitting: Obstetrics & Gynecology

## 2015-12-02 VITALS — BP 94/60 | HR 84 | Resp 16 | Ht 66.0 in | Wt 160.0 lb

## 2015-12-02 DIAGNOSIS — Z205 Contact with and (suspected) exposure to viral hepatitis: Secondary | ICD-10-CM

## 2015-12-02 DIAGNOSIS — Z01419 Encounter for gynecological examination (general) (routine) without abnormal findings: Secondary | ICD-10-CM

## 2015-12-02 LAB — HEPATITIS C ANTIBODY: HCV Ab: NEGATIVE

## 2015-12-02 MED ORDER — DESVENLAFAXINE SUCCINATE ER 50 MG PO TB24: 50.0000 mg | ORAL_TABLET | Freq: Every day | ORAL | Status: DC

## 2015-12-02 NOTE — Progress Notes (Signed)
55 y.o. G0P0 SingleCaucasianF here for annual exam.  Doing well except she is very frustrated with her weight.  D/W pt options for weight loss programs.    Denies vaginal bleeding.    Patient's last menstrual period was 03/30/2011.          Sexually active: No.  The current method of family planning is post menopausal status.    Exercising: No.  The patient does not participate in regular exercise at present. Smoker:  no  Health Maintenance: Pap:  08/16/14 Neg. 03/31/12 Neg. HR HPV:neg History of abnormal Pap:  no MMG:  08/30/15 BIRADS1:neg Colonoscopy:  05/29/15 Polyps - repeat 5 years.   BMD:   03/2014 Dr. Wylene Simmerisovec  TDaP:  12/2006  Pneumonia vaccine(s):  No Zostavax: ~2015 Hep C testing: No Screening Labs: PCP, Urine today: PCP   reports that she has never smoked. She has never used smokeless tobacco. She reports that she drinks alcohol. She reports that she does not use illicit drugs.  Past Medical History  Diagnosis Date  . Hyperlipidemia     with High Small Particle LDL  . Allergy   . Arthritis   . Thyroid disease     hx of, no meds now  . IBS (irritable bowel syndrome)   . Graves' disease 1990  . Dysmenorrhea   . Syringomyelia ([facility]) 2003  . Microprolactinoma ([facility])   . Leukopenia   . Alopecia areata   . Spondylolisthesis of lumbar region     Past Surgical History  Procedure Laterality Date  . Tonsillectomy and adenoidectomy  1968  . Colonoscopy  11/2010  . Bunionectomy  07/2013    left foot    Current Outpatient Prescriptions  Medication Sig Dispense Refill  . aspirin 81 MG tablet Take 81 mg by mouth daily.      . Biotin 10 MG CAPS Take by mouth daily.    . cabergoline (DOSTINEX) 0.5 MG tablet 2 (two) times a week.     . cetirizine (ZYRTEC) 10 MG tablet Take 10 mg by mouth daily.    . cholecalciferol (VITAMIN D) 1000 units tablet Take 1,000 Units by mouth daily.    . Esomeprazole Magnesium (NEXIUM) 2.5 MG PACK Take by mouth daily.    . Lactobacillus (PROBIOTIC  ACIDOPHILUS PO) Take by mouth daily.    Marland Kitchen. PRISTIQ 50 MG 24 hr tablet TAKE 1 TABLET(50 MG) BY MOUTH DAILY 30 tablet 1  . simvastatin (ZOCOR) 40 MG tablet 1 tablet Daily.     No current facility-administered medications for this visit.    Family History  Problem Relation Age of Onset  . Colon cancer Neg Hx   . Heart disease Mother   . Hypertension Father   . Heart disease Father     ROS:  Pertinent items are noted in HPI.  Otherwise, a comprehensive ROS was negative.  Exam:   BP 94/60 mmHg  Pulse 84  Resp 16  Ht 5\' 6"  (1.676 m)  Wt 160 lb (72.576 kg)  BMI 25.84 kg/m2  LMP 03/30/2011  Weight change: +4#   Height: 5\' 6"  (167.6 cm)  Ht Readings from Last 3 Encounters:  12/02/15 5\' 6"  (1.676 m)  05/29/15 5\' 6"  (1.676 m)  05/09/15 5\' 6"  (1.676 m)    General appearance: alert, cooperative and appears stated age Head: Normocephalic, without obvious abnormality, atraumatic Neck: no adenopathy, supple, symmetrical, trachea midline and thyroid normal to inspection and palpation Lungs: clear to auscultation bilaterally Breasts: normal appearance, no masses or tenderness  Heart: regular rate and rhythm Abdomen: soft, non-tender; bowel sounds normal; no masses,  no organomegaly Extremities: extremities normal, atraumatic, no cyanosis or edema Skin: Skin color, texture, turgor normal. No rashes or lesions Lymph nodes: Cervical, supraclavicular, and axillary nodes normal. No abnormal inguinal nodes palpated Neurologic: Grossly normal   Pelvic: External genitalia:  no lesions              Urethra:  normal appearing urethra with no masses, tenderness or lesions              Bartholins and Skenes: normal                 Vagina: normal appearing vagina with normal color and discharge, no lesions              Cervix: no lesions              Pap taken: No. Bimanual Exam:  Uterus:  normal size, contour, position, consistency, mobility, non-tender              Adnexa: normal adnexa and no  mass, fullness, tenderness               Rectovaginal: Confirms               Anus:  normal sphincter tone, no lesions  Chaperone was present for exam.  A:  Well Woman with normal exam PMP, no HRT H/O microprolactinoma H/O Grave's Dx H/O colon polyps  P: Mammogram yearly. Pt aware of increased breast density Yearly blood work and vaccines done by Dr. Osborne Casco.  He also tests prolactin yearly. pap smear with neg HR HPV 10/13. pap neg 2016.  No pap smear today. RF prestiq 50mg . #30/13 RF Hep C antibody testing today return annually or prn

## 2015-12-30 ENCOUNTER — Other Ambulatory Visit: Payer: Self-pay | Admitting: Obstetrics and Gynecology

## 2016-06-04 ENCOUNTER — Encounter: Payer: Self-pay | Admitting: Obstetrics & Gynecology

## 2016-06-04 ENCOUNTER — Telehealth: Payer: Self-pay | Admitting: *Deleted

## 2016-06-04 NOTE — Telephone Encounter (Signed)
Dr. Sabra Heck, see MyChart message below and advise?     From Osa Craver To Megan Salon, MD Sent 06/04/2016 11:24 AM  I was out of town for a week and forgot my Pristiq. After 3 days of feeling strange, I realized what I was missing and the pharmacist in [place] filled it for me but they only had the generic.  So I have taken the generic for 7 days now. I am still not quite feeling "right". A little off balance and having lots of short hot flashes (which I was not having before). Trouble sleeping.No energy .  Do you think any of this is due to the generic? Advice?  Thank you!  Darlene Walker  03/11/1961  [phone]

## 2016-06-05 NOTE — Telephone Encounter (Signed)
I already replied to this and advised to go back to the branded rx.  Thanks.

## 2016-06-10 ENCOUNTER — Telehealth: Payer: Self-pay

## 2016-06-10 MED ORDER — PRISTIQ 50 MG PO TB24: 50.0000 mg | ORAL_TABLET | Freq: Every day | ORAL | 6 refills | Status: DC

## 2016-06-10 NOTE — Telephone Encounter (Signed)
Thank you for update.  Ok to close encounter.

## 2016-06-10 NOTE — Telephone Encounter (Signed)
Please see telephone encounter dated 06/10/2016.

## 2016-06-10 NOTE — Telephone Encounter (Signed)
Any way you could call the pharmacy? They are saying it is too soon to refill and insurance won't pay.....  Crazy to me since the pharmacy did not have the Pristiq and substituted this. Maybe even write a new prescription for a partial dose?   [facility] on [address]  ----- Message -----  From: Lyman Speller, MD  Sent: 06/05/2016 12:43 PM EST  To: Darlene Walker  Subject: RE: Non-Urgent Medical Question  This sounds related because it's the only think that has changed. I would switch back to the branded if you can. Let me know how I can help.    Edwinna Areola, MD    ----- Message -----   From: Darlene Walker   Sent: 06/04/2016 11:24 AM EST    To: Lyman Speller, MD  Subject: Non-Urgent Medical Question    I was out of town for a week and forgot my Pristiq. After 3 days of feeling strange, I realized what I was missing and the pharmacist in [place] filled it for me but they only had the generic.   So I have taken the generic for 7 days now. I am still not quite feeling "right". A little off balance and having lots of short hot flashes (which I was not having before). Trouble sleeping.No energy .   Do you think any of this is due to the generic? Advice?  Thank you!  Darlene Walker  04/06/1961  [phone]   Sent in new Rx for brand name only Pristiq 50 mg daily #30 6RF to pharmacy on file. Advised patient new rx has been sent. Advised as this rx is DAW she may be able to pick it up early. If not may have to pay OOP for a partial dose until next refill is due. Aware if she needs any further assistance or PA through insurance to contact our office. Patient is agreeable.  Dr.Miller, agree with plan?

## 2016-07-01 ENCOUNTER — Encounter: Payer: Self-pay | Admitting: Obstetrics & Gynecology

## 2016-07-02 ENCOUNTER — Telehealth: Payer: Self-pay | Admitting: Obstetrics & Gynecology

## 2016-07-02 MED ORDER — DESVENLAFAXINE SUCCINATE ER 50 MG PO TB24: 50.0000 mg | ORAL_TABLET | Freq: Every day | ORAL | 6 refills | Status: DC

## 2016-07-02 NOTE — Telephone Encounter (Signed)
Rx was sent to pharmacy on file.  

## 2016-07-02 NOTE — Telephone Encounter (Signed)
Dr. Sabra Heck, see MyChart message below. Ok to fill pristiq as generic?  From Osa Craver To Megan Salon, MD Sent 07/01/2016 10:18 PM  I am writing in response to the Pristiq issue AGAIN-sorry!   Insurance still would not cover the [place], even when you sent the order for Pristiq only. I have adjusted to the generic , although I find that it is not as helpful with the hotflashes.   Now.Marland KitchenMarland KitchenBlue Cross has now decided that Pristiq is a non-preferred drug so my part would be $383 a month-can't do it.   Now [facility] will not fill the generic b/c of the last order for Pristiq only. So.Marland KitchenMarland KitchenI need you to call [facility] back , please, and ok the prescription to be either generic OR Pristiq (in case they overturn) .   I am totally out so I need to pick this up ASAP on Thursday. [place] so much!   Alan Ripper  [phone]

## 2016-07-02 NOTE — Telephone Encounter (Signed)
Patient sent Dr Sabra Heck a MyChart message about a medication and wants to confirm that she received the message.

## 2016-07-02 NOTE — Telephone Encounter (Signed)
Spoke with patient. Advised spoke with [facility], ok to fill as generic. Patient states she just can't afford the brand Pristiq and would rather use the generic than pay the out of pocket cost. Advised patient will return call if any additional recommendations from Dr. Sabra Heck, patient is agreeable.  Dr. Sabra Heck, any additional recommendations?

## 2016-07-02 NOTE — Telephone Encounter (Signed)
We can always try to do a prior authorization for the branded and see if we can get it approved.  Just a thought.

## 2016-07-02 NOTE — Telephone Encounter (Signed)
Spoke with Truman Hayward at [facility]. Advised ok to fill Pristiq as generic.

## 2016-07-03 ENCOUNTER — Other Ambulatory Visit: Payer: Self-pay | Admitting: *Deleted

## 2016-08-06 ENCOUNTER — Telehealth: Payer: Self-pay

## 2016-08-06 NOTE — Telephone Encounter (Signed)
Spoke with patient. Advised patient PA was submitted to her insurance for coverage of brand name only Pristiq. Received letter from [place] which states this has been denied because her pharmacy benefit plan has a front end deductible that must be met. Patient states she received letter as well and has spoken with her insurance. Verbalizes understanding. Will stay on generic at this time due to cost. Form from insurance to scan.  Routing to provider for final review. Patient agreeable to disposition. Will close encounter.

## 2016-08-06 NOTE — Telephone Encounter (Signed)
See telephone encounter dated 08/06/16- follow-up with PA.

## 2016-09-24 ENCOUNTER — Encounter: Payer: Self-pay | Admitting: Obstetrics & Gynecology

## 2017-03-10 NOTE — Progress Notes (Signed)
56 y.o. G0P0 Single Caucasian F here for annual exam.  Reports she is having a lot of fatigue and naps once or twice a day.  Working really hard but feels very fatigued in the evening.  Just saw Dr. Osborne Casco recently and had a lot of blood work done.  Has been experiencing some chest "clutching" and discomfort.  She realized she wasn't on Dexilant and restarted this.  These symptoms have resolved.    She states her hot flashes have come back.  She is not having these at night.  States she just feels a little "off" right now.  Taking Prestiq and has added [place] but decided to stop the [place].    Denies vaginal bleeding.   Patient's last menstrual period was 03/30/2011.          Sexually active: No.  The current method of family planning is post menopausal status.    Exercising: No.  The patient does not participate in regular exercise at present. Smoker:  no  Health Maintenance: Pap:  08/16/14 negative, 03/31/12 negative, HR HPV negative  History of abnormal Pap:  no MMG:  08/31/16 BIRADS 1 negative  Colonoscopy: 05/29/15 Dr. Henrene Pastor.  Follow up 5 years. BMD:   03/2014 with Dr. Osborne Casco  TDaP:  12/28/06  Pneumonia vaccine(s):  never Zostavax: 2016  Hep C testing: 12/02/15 negative  Screening Labs: PCP takes care of labs   reports that she has never smoked. She has never used smokeless tobacco. She reports that she drinks alcohol. She reports that she does not use drugs.  Past Medical History:  Diagnosis Date  . Allergy   . Alopecia areata   . Arthritis   . Dysmenorrhea   . Graves' disease 1990  . Hyperlipidemia    with High Small Particle LDL  . IBS (irritable bowel syndrome)   . Leukopenia   . 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 Spondylolisthesis of lumbar region   . Syringomyelia ([place]) 2003  . Thyroid disease    hx of, no meds now    Past Surgical History:  Procedure Laterality Date  . BUNIONECTOMY  07/2013   left foot  . COLONOSCOPY  11/2010  . TONSILLECTOMY AND ADENOIDECTOMY  1968     Current Outpatient Prescriptions  Medication Sig Dispense Refill  . aspirin 81 MG tablet Take 81 mg by mouth daily.      . Biotin 10 MG CAPS Take by mouth daily.    . cabergoline (DOSTINEX) 0.5 MG tablet 2 (two) times a week.     . cetirizine (ZYRTEC) 10 MG tablet Take 10 mg by mouth daily.    Marland Kitchen desvenlafaxine (PRISTIQ) 50 MG 24 hr tablet Take 1 tablet (50 mg total) by mouth daily. 30 tablet 6  . DEXILANT 60 MG capsule Take 1 capsule by mouth daily.  4  . simvastatin (ZOCOR) 40 MG tablet 1 tablet Daily.    . cholecalciferol (VITAMIN D) 1000 units tablet Take 1,000 Units by mouth daily.    . Lactobacillus (PROBIOTIC ACIDOPHILUS PO) Take by mouth daily.     No current facility-administered medications for this visit.     Family History  Problem Relation Age of Onset  . Hypertension Father   . Heart disease Father   . Heart disease Mother   . Colon cancer Neg Hx     ROS:  Pertinent items are noted in HPI.  Otherwise, a comprehensive ROS was negative.  Exam:   BP 116/72 (BP Location: Right Arm, Patient Position: Sitting,  Cuff Size: Normal)   Pulse 80   Resp 16   Ht 5' 5.75" (1.67 m)   Wt 157 lb (71.2 kg)   LMP 03/30/2011   BMI 25.53 kg/m   Weight change:-3# Height: 5' 5.75" (167 cm)  Ht Readings from Last 3 Encounters:  03/11/17 5' 5.75" (1.67 m)  12/02/15 5\' 6"  (1.676 m)  05/29/15 5\' 6"  (1.676 m)    General appearance: alert, cooperative and appears stated age Head: Normocephalic, without obvious abnormality, atraumatic Neck: no adenopathy, supple, symmetrical, trachea midline and thyroid normal to inspection and palpation Lungs: clear to auscultation bilaterally Breasts: normal appearance, no masses or tenderness Heart: regular rate and rhythm Abdomen: soft, non-tender; bowel sounds normal; no masses,  no organomegaly Extremities: extremities normal, atraumatic, no cyanosis or edema Skin: Skin color, texture, turgor normal. No rashes or lesions Lymph nodes:  Cervical, supraclavicular, and axillary nodes normal. No abnormal inguinal nodes palpated Neurologic: Grossly normal   Pelvic: External genitalia:  no lesions              Urethra:  normal appearing urethra with no masses, tenderness or lesions              Bartholins and Skenes: normal                 Vagina: normal appearing vagina with normal color and discharge, no lesions              Cervix: no lesions              Pap taken: Yes.   Bimanual Exam:  Uterus:  normal size, contour, position, consistency, mobility, non-tender              Adnexa: normal adnexa and no mass, fullness, tenderness               Rectovaginal: Confirms               Anus:  normal sphincter tone, no lesions  Chaperone was present for exam.  A:  Well Woman with normal exam PMP, no HRT H/O microprolactinoma H/O Grave s disease H/o colon polyps Increased hot flashes, "foggy feeling"  P:   Mammogram guidelines reviewed pap smear with HR HPV obtained today Refilled prestiq 50mg  daily.  #30/12RF HBA1C and T4 obtained today Tdap due.  Will return for this. return annually or prn

## 2017-03-11 ENCOUNTER — Encounter: Payer: Self-pay | Admitting: Obstetrics & Gynecology

## 2017-03-11 ENCOUNTER — Other Ambulatory Visit (HOSPITAL_COMMUNITY)
Admission: RE | Admit: 2017-03-11 | Discharge: 2017-03-11 | Disposition: A | Discharge status: Home / Self Care | Payer: BC Managed Care – PPO | Source: Ambulatory Visit | Attending: Obstetrics & Gynecology | Admitting: Obstetrics & Gynecology

## 2017-03-11 ENCOUNTER — Ambulatory Visit (INDEPENDENT_AMBULATORY_CARE_PROVIDER_SITE_OTHER): Payer: BC Managed Care – PPO | Admitting: Obstetrics & Gynecology

## 2017-03-11 VITALS — BP 116/72 | HR 80 | Resp 16 | Ht 65.75 in | Wt 157.0 lb

## 2017-03-11 DIAGNOSIS — Z01419 Encounter for gynecological examination (general) (routine) without abnormal findings: Secondary | ICD-10-CM | POA: Diagnosis not present

## 2017-03-11 DIAGNOSIS — Z124 Encounter for screening for malignant neoplasm of cervix: Secondary | ICD-10-CM | POA: Diagnosis not present

## 2017-03-11 DIAGNOSIS — Z8639 Personal history of other endocrine, nutritional and metabolic disease: Secondary | ICD-10-CM

## 2017-03-11 DIAGNOSIS — N951 Menopausal and female climacteric states: Secondary | ICD-10-CM | POA: Diagnosis not present

## 2017-03-11 MED ORDER — DESVENLAFAXINE SUCCINATE ER 50 MG PO TB24: 50.0000 mg | ORAL_TABLET | Freq: Every day | ORAL | 12 refills | Status: DC

## 2017-03-12 LAB — HEMOGLOBIN A1C
Est. average glucose Bld gHb Est-mCnc: 111 mg/dL
Hgb A1c MFr Bld: 5.5 % (ref 4.8–5.6)

## 2017-03-12 LAB — T4, FREE: Free T4: 1.1 ng/dL (ref 0.82–1.77)

## 2017-03-13 ENCOUNTER — Encounter: Payer: Self-pay | Admitting: Obstetrics & Gynecology

## 2017-03-15 LAB — CYTOLOGY - PAP: Diagnosis: NEGATIVE

## 2017-09-21 ENCOUNTER — Encounter: Payer: Self-pay | Admitting: Obstetrics & Gynecology

## 2017-09-29 ENCOUNTER — Telehealth: Payer: Self-pay | Admitting: Obstetrics & Gynecology

## 2017-09-29 ENCOUNTER — Encounter: Payer: Self-pay | Admitting: Obstetrics & Gynecology

## 2017-09-29 NOTE — Telephone Encounter (Signed)
Message   ----- Message from [place], Generic sent at 09/29/2017 1:36 PM EDT -----    I got a message with mammography results, but it is blank. Can you resend?

## 2017-11-25 ENCOUNTER — Encounter (HOSPITAL_BASED_OUTPATIENT_CLINIC_OR_DEPARTMENT_OTHER): Payer: Self-pay

## 2017-11-25 ENCOUNTER — Other Ambulatory Visit: Payer: Self-pay

## 2017-11-25 DIAGNOSIS — Z23 Encounter for immunization: Secondary | ICD-10-CM | POA: Insufficient documentation

## 2017-11-25 DIAGNOSIS — Z2914 Encounter for prophylactic rabies immune globin: Secondary | ICD-10-CM | POA: Insufficient documentation

## 2017-11-25 DIAGNOSIS — Z203 Contact with and (suspected) exposure to rabies: Secondary | ICD-10-CM | POA: Diagnosis not present

## 2017-11-25 DIAGNOSIS — Z7982 Long term (current) use of aspirin: Secondary | ICD-10-CM | POA: Insufficient documentation

## 2017-11-25 DIAGNOSIS — Z79899 Other long term (current) drug therapy: Secondary | ICD-10-CM | POA: Insufficient documentation
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
## 2017-11-26 ENCOUNTER — Emergency Department (HOSPITAL_BASED_OUTPATIENT_CLINIC_OR_DEPARTMENT_OTHER)
Admission: EM | Admit: 2017-11-26 | Discharge: 2017-11-26 | Disposition: A | Discharge status: Home / Self Care | Payer: BC Managed Care – PPO | Attending: Emergency Medicine | Admitting: Emergency Medicine

## 2017-11-26 DIAGNOSIS — Z203 Contact with and (suspected) exposure to rabies: Secondary | ICD-10-CM

## 2017-11-26 MED ORDER — RABIES IMMUNE GLOBULIN 150 UNIT/ML IM INJ
20.0000 [IU]/kg | INJECTION | Freq: Once | INTRAMUSCULAR | Status: AC
  Administered 2017-11-26: 1425 [IU]
  Filled 2017-11-26: qty 2

## 2017-11-26 MED ORDER — RABIES VACCINE, PCEC IM SUSR
1.0000 mL | Freq: Once | INTRAMUSCULAR | Status: AC
  Administered 2017-11-26: 1 mL via INTRAMUSCULAR
  Filled 2017-11-26: qty 1

## 2017-11-26 MED ORDER — TETANUS-DIPHTH-ACELL PERTUSSIS 5-2.5-18.5 LF-MCG/0.5 IM SUSP: 0.5000 mL | Freq: Once | INTRAMUSCULAR | Status: DC

## 2017-11-28 ENCOUNTER — Ambulatory Visit (HOSPITAL_COMMUNITY)
Admission: EM | Admit: 2017-11-28 | Discharge: 2017-11-28 | Disposition: A | Discharge status: Home / Self Care | Payer: BC Managed Care – PPO | Attending: Family Medicine | Admitting: Family Medicine

## 2017-11-28 DIAGNOSIS — Z23 Encounter for immunization: Secondary | ICD-10-CM

## 2017-11-28 DIAGNOSIS — Z203 Contact with and (suspected) exposure to rabies: Secondary | ICD-10-CM

## 2017-11-28 MED ORDER — RABIES VACCINE, PCEC IM SUSR
1.0000 mL | Freq: Once | INTRAMUSCULAR | Status: AC
  Administered 2017-11-28: 1 mL via INTRAMUSCULAR

## 2017-11-28 MED ORDER — RABIES VACCINE, PCEC IM SUSR
INTRAMUSCULAR | Status: AC
  Filled 2017-11-28: qty 1

## 2017-11-28 NOTE — ED Notes (Signed)
Bed: UCTR Expected date:  Expected time:  Means of arrival:  Comments: 

## 2017-11-28 NOTE — ED Triage Notes (Signed)
Pt here for day 3 rabies shot.  

## 2017-12-01 ENCOUNTER — Encounter (HOSPITAL_BASED_OUTPATIENT_CLINIC_OR_DEPARTMENT_OTHER): Payer: Self-pay | Admitting: Emergency Medicine

## 2017-12-01 NOTE — ED Provider Notes (Signed)
[facility] Provider Note   CSN: [id number] Arrival date & time: 11/25/17  2205     History   Chief Complaint Chief Complaint  Patient presents with  . Rabies Injection    HPI Darlene Walker is a 57 y.o. female.  The history is provided by the patient.  Illness  This is a new problem. The current episode started 2 days ago. The problem occurs constantly. The problem has not changed since onset.Pertinent negatives include no chest pain, no abdominal pain, no headaches and no shortness of breath. Nothing aggravates the symptoms. She has tried nothing for the symptoms. The treatment provided no relief.  Exposed to a bat in a home.    Past Medical History:  Diagnosis Date  . Allergy   . Alopecia areata   . Arthritis   . Dysmenorrhea   . Graves' disease 1990  . Hyperlipidemia    with High Small Particle LDL  . IBS (irritable bowel syndrome)   . Leukopenia   . 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 Spondylolisthesis of lumbar region   . Syringomyelia ([place]) 2003  . Thyroid disease    hx of, no meds now    Patient Active Problem List   Diagnosis Date Noted  . Hx of adenomatous polyp of colon 05/09/2015  . 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 BUNIONECTOMY  07/2013   left foot  . COLONOSCOPY  11/2010  . TONSILLECTOMY AND ADENOIDECTOMY  1968     OB History    Gravida  0   Para      Term      Preterm      AB      Living        SAB      TAB      Ectopic      Multiple      Live Births               Home Medications    Prior to Admission medications   Medication Sig Start Date End Date Taking? Authorizing Provider  aspirin 81 MG tablet Take 81 mg by mouth daily.      [provider]  Biotin 10 MG CAPS Take by mouth daily.    [provider]  cabergoline (DOSTINEX) 0.5 MG tablet 2 (two) times a week.  11/05/12   [provider]  cetirizine  (ZYRTEC) 10 MG tablet Take 10 mg by mouth daily.    [provider]  cholecalciferol (VITAMIN D) 1000 units tablet Take 1,000 Units by mouth daily.    [provider]  desvenlafaxine (PRISTIQ) 50 MG 24 hr tablet Take 1 tablet (50 mg total) by mouth daily. 03/11/17   Megan Salon, MD  DEXILANT 60 MG capsule Take 1 capsule by mouth daily. 12/25/16   [provider]  Lactobacillus (PROBIOTIC ACIDOPHILUS PO) Take by mouth daily.    [provider]  simvastatin (ZOCOR) 40 MG tablet 1 tablet Daily. 09/23/10   [provider]    Family History Family History  Problem Relation Age of Onset  . Hypertension Father   . Heart disease Father   . Heart disease Mother   . Colon cancer Neg Hx     Social History Social History   Tobacco Use  . Smoking status: Never Smoker  . Smokeless tobacco: Never Used  Substance Use Topics  . Alcohol use: Yes  Alcohol/week: 0.0 oz  . Drug use: No     Allergies   Patient has no known allergies.   Review of Systems Review of Systems  Constitutional: Negative for fever.  HENT: Negative for congestion.   Eyes: Negative for photophobia.  Respiratory: Negative for shortness of breath.   Cardiovascular: Negative for chest pain.  Gastrointestinal: Negative for abdominal pain.  Genitourinary: Negative for dysuria.  Skin: Negative for wound.  Neurological: Negative for headaches.  All other systems reviewed and are negative.    Physical Exam Updated Vital Signs BP 110/76   Pulse 82   Temp 98.3 F (36.8 C) (Oral)   Resp 18   Ht 5\' 3"  (1.6 m)   Wt 71.4 kg (157 lb 6.5 oz)   LMP 03/30/2011   SpO2 98%   BMI 27.88 kg/m   Physical Exam  Constitutional: She is oriented to person, place, and time. She appears well-developed and well-nourished. No distress.  HENT:  Head: Normocephalic and atraumatic.  Mouth/Throat: No oropharyngeal exudate.  Eyes: Pupils are equal, round, and reactive to light.  Conjunctivae are normal.  Neck: Normal range of motion. Neck supple.  Cardiovascular: Normal rate, regular rhythm, normal heart sounds and intact distal pulses.  Pulmonary/Chest: Effort normal and breath sounds normal. No stridor. She has no wheezes. She has no rales.  Abdominal: Soft. Bowel sounds are normal. She exhibits no mass. There is no tenderness. There is no rebound and no guarding.  Musculoskeletal: Normal range of motion.  Neurological: She is alert and oriented to person, place, and time. She displays normal reflexes.  Skin: Skin is warm and dry. Capillary refill takes less than 2 seconds.  Psychiatric: She has a normal mood and affect.     ED Treatments / Results  Labs (all labs ordered are listed, but only abnormal results are displayed) Labs Reviewed - No data to display  EKG None  Radiology No results found.  Procedures Procedures (including critical care time)  Medications Ordered in ED Medications  rabies vaccine (RABAVERT) injection 1 mL (1 mL Intramuscular Given 11/26/17 0121)  rabies immune globulin (HYPERAB/KEDRAB) injection 1,425 Units (1,425 Units Infiltration Given 11/26/17 0113)       Final Clinical Impressions(s) / ED Diagnoses   Final diagnoses:  Rabies exposure    Return for weakness, numbness, changes in vision or speech, fevers >100.4 unrelieved by medication, shortness of breath, intractable vomiting, or diarrhea, abdominal pain, Inability to tolerate liquids or food, cough, altered mental status or any concerns. No signs of systemic illness or infection. The patient is nontoxic-appearing on exam and vital signs are within normal limits.   I have reviewed the triage vital signs and the nursing notes. Pertinent labs &imaging results that were available during my care of the patient were reviewed by me and considered in my medical decision making (see chart for details).  After history, exam, and medical workup I feel the patient has  been appropriately medically screened and is safe for discharge home. Pertinent diagnoses were discussed with the patient. Patient was given return precautions.         Terrez Ander, MD 12/01/17 2310

## 2018-03-14 ENCOUNTER — Other Ambulatory Visit (HOSPITAL_COMMUNITY)
Admission: RE | Admit: 2018-03-14 | Discharge: 2018-03-14 | Disposition: A | Discharge status: Home / Self Care | Payer: BC Managed Care – PPO | Source: Ambulatory Visit | Attending: Obstetrics & Gynecology | Admitting: Obstetrics & Gynecology

## 2018-03-14 ENCOUNTER — Other Ambulatory Visit: Payer: Self-pay

## 2018-03-14 ENCOUNTER — Ambulatory Visit: Payer: BC Managed Care – PPO | Admitting: Obstetrics & Gynecology

## 2018-03-14 ENCOUNTER — Encounter: Payer: Self-pay | Admitting: Obstetrics & Gynecology

## 2018-03-14 VITALS — BP 118/64 | HR 96 | Resp 18 | Ht 65.75 in | Wt 153.4 lb

## 2018-03-14 DIAGNOSIS — T148XXA Other injury of unspecified body region, initial encounter: Secondary | ICD-10-CM | POA: Diagnosis not present

## 2018-03-14 DIAGNOSIS — D352 Benign neoplasm of pituitary gland: Secondary | ICD-10-CM | POA: Insufficient documentation

## 2018-03-14 DIAGNOSIS — Z0000 Encounter for general adult medical examination without abnormal findings: Secondary | ICD-10-CM | POA: Insufficient documentation

## 2018-03-14 DIAGNOSIS — Z124 Encounter for screening for malignant neoplasm of cervix: Secondary | ICD-10-CM

## 2018-03-14 DIAGNOSIS — Z01419 Encounter for gynecological examination (general) (routine) without abnormal findings: Secondary | ICD-10-CM

## 2018-03-14 MED ORDER — DESVENLAFAXINE SUCCINATE ER 50 MG PO TB24: 50.0000 mg | ORAL_TABLET | Freq: Every day | ORAL | 4 refills | Status: DC

## 2018-03-14 NOTE — Progress Notes (Signed)
57 y.o. G0P0 [name] here for annual exam.  Was exposed to a bat in May.  Had to have injections.    Denies vaginal bleeding.    Has appt/blood work planned with Dr. Osborne Casco in October.    Pt was on prednisone last week for poison ivy.  She's felt sort of "swimmy headed" since then.  She is off the prednisone now and has been for several days.  She is still feeling the same.    Also, she has a new puppy.  She has several very large bruises on her that seem much larger than they should be.    Also having some hot flashes that were under really good control with Prestiq.    Had knee MRI on Thursday.  Has undergone 3 cortisone injections as well as three synvisc injections.  This hasn't helped at all.     Patient's last menstrual period was 03/30/2011.          Sexually active: Yes.    The current method of family planning is post menopausal status.    Exercising: Yes.    yoga Smoker:  no  Health Maintenance: Pap:  03/11/17 Neg   08/16/14 Neg  History of abnormal Pap:  no MMG:  09/11/17 BIRADS1:neg  Colonoscopy:  05/29/15 polyp. F/u 5 years  BMD:   2015 PCP TDaP:  2018 with Dr. Osborne Casco Pneumonia vaccine(s):  n/a Shingrix: No Hep C testing: 12/02/15 Neg  Screening Labs: will obtain today   reports that she has never smoked. She has never used smokeless tobacco. She reports that she drinks alcohol. She reports that she does not use drugs.  Past Medical History:  Diagnosis Date  . Allergy   . Alopecia areata   . Arthritis   . Dysmenorrhea   . Graves' disease 1990  . Hyperlipidemia    with High Small Particle LDL  . IBS (irritable bowel syndrome)   . Leukopenia   . 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 Spondylolisthesis of lumbar region   . Syringomyelia ([place]) 2003  . Thyroid disease    hx of, no meds now    Past Surgical History:  Procedure Laterality Date  . BUNIONECTOMY  07/2013   left foot  . COLONOSCOPY  11/2010  . TONSILLECTOMY AND ADENOIDECTOMY  1968     Current Outpatient Medications  Medication Sig Dispense Refill  . aspirin 81 MG tablet Take 81 mg by mouth daily.      . Biotin 10 MG CAPS Take by mouth daily.    . cabergoline (DOSTINEX) 0.5 MG tablet 2 (two) times a week.     . celecoxib (CELEBREX) 200 MG capsule Take 1 capsule by mouth daily as needed.  0  . desvenlafaxine (PRISTIQ) 50 MG 24 hr tablet Take 1 tablet (50 mg total) by mouth daily. 30 tablet 12  . DEXILANT 60 MG capsule Take 1 capsule by mouth daily.  4  . simvastatin (ZOCOR) 40 MG tablet 1 tablet Daily.     No current facility-administered medications for this visit.     Family History  Problem Relation Age of Onset  . Hypertension Father   . Heart disease Father   . Heart disease Mother   . Colon cancer Neg Hx     Review of Systems  Endocrine: Positive for cold intolerance and heat intolerance.       Craving sweets   Musculoskeletal:       Muscle weakness   Neurological: Positive for  headaches.  Hematological: Bruises/bleeds easily.    Exam:   BP 118/64 (BP Location: Right Arm, Patient Position: Sitting, Cuff Size: Normal)   Pulse 96   Resp 18   Ht 5' 5.75" (1.67 m)   Wt 153 lb 6.4 oz (69.6 kg)   LMP 03/30/2011   BMI 24.95 kg/m    Height: 5' 5.75" (167 cm)  Ht Readings from Last 3 Encounters:  03/14/18 5' 5.75" (1.67 m)  11/26/17 5\' 3"  (1.6 m)  03/11/17 5' 5.75" (1.67 m)    General appearance: alert, cooperative and appears stated age Head: Normocephalic, without obvious abnormality, atraumatic Neck: no adenopathy, supple, symmetrical, trachea midline and thyroid normal to inspection and palpation Lungs: clear to auscultation bilaterally Breasts: normal appearance, no masses or tenderness Heart: regular rate and rhythm Abdomen: soft, non-tender; bowel sounds normal; no masses,  no organomegaly Extremities: extremities normal, atraumatic, no cyanosis or edema Skin: Skin color, texture, turgor normal. No rashes or lesions Lymph nodes:  Cervical, supraclavicular, and axillary nodes normal. No abnormal inguinal nodes palpated Neurologic: Grossly normal   Pelvic: External genitalia:  no lesions              Urethra:  normal appearing urethra with no masses, tenderness or lesions              Bartholins and Skenes: normal                 Vagina: normal appearing vagina with normal color and discharge, no lesions              Cervix: no lesions              Pap taken: Yes.   Bimanual Exam:  Uterus:  normal size, contour, position, consistency, mobility, non-tender              Adnexa: normal adnexa and no mass, fullness, tenderness               Rectovaginal: Confirms               Anus:  normal sphincter tone, no lesions  Chaperone was present for exam.  A:  Well Woman with normal exam PMP, no HT H/O microprolactinoma H/O Grave's disease  Colon polyps, colonoscopy due 2021  P:   Mammogram guidelines reviewed.  Doing 3D.  pap smear with HR HPV obtained today RF for prestiq 50mg  daily.  #90/4RF TSH, CMP, CBC, prolactin obtained today PT, PTT, and INR Shingrix information given return annually or prn

## 2018-03-14 NOTE — Patient Instructions (Signed)
[facility] Phone: [phone] ([phone] Location: Lower level of [facility] ([address]) Hours: 7:30 a.m. to 6:00 p.m., Monday through Friday.   [facility] Phone: [phone] Location: [address] Hours: 7:30 a.m. to 6:00 p.m., Monday through Friday.  

## 2018-03-15 LAB — COMPREHENSIVE METABOLIC PANEL
ALT: 21 IU/L (ref 0–32)
AST: 20 IU/L (ref 0–40)
Albumin/Globulin Ratio: 1.9 (ref 1.2–2.2)
Albumin: 4.6 g/dL (ref 3.5–5.5)
Alkaline Phosphatase: 92 IU/L (ref 39–117)
BUN/Creatinine Ratio: 17 (ref 9–23)
BUN: 16 mg/dL (ref 6–24)
Bilirubin Total: 0.3 mg/dL (ref 0.0–1.2)
CO2: 26 mmol/L (ref 20–29)
Calcium: 9.5 mg/dL (ref 8.7–10.2)
Chloride: 98 mmol/L (ref 96–106)
Creatinine, Ser: 0.94 mg/dL (ref 0.57–1.00)
GFR calc Af Amer: 77 mL/min/{1.73_m2} (ref >59)
GFR calc non Af Amer: 67 mL/min/{1.73_m2} (ref >59)
Globulin, Total: 2.4 g/dL (ref 1.5–4.5)
Glucose: 81 mg/dL (ref 65–99)
Potassium: 4.4 mmol/L (ref 3.5–5.2)
Sodium: 140 mmol/L (ref 134–144)
Total Protein: 7 g/dL (ref 6.0–8.5)

## 2018-03-15 LAB — CBC
Hematocrit: 41.3 % (ref 34.0–46.6)
Hemoglobin: 13.3 g/dL (ref 11.1–15.9)
MCH: 28.3 pg (ref 26.6–33.0)
MCHC: 32.2 g/dL (ref 31.5–35.7)
MCV: 88 fL (ref 79–97)
Platelets: 280 10*3/uL (ref 150–450)
RBC: 4.7 x10E6/uL (ref 3.77–5.28)
RDW: 13.4 % (ref 12.3–15.4)
WBC: 7.1 10*3/uL (ref 3.4–10.8)

## 2018-03-15 LAB — PROTIME-INR
INR: 1 (ref 0.8–1.2)
Prothrombin Time: 10.4 s (ref 9.1–12.0)

## 2018-03-15 LAB — APTT: aPTT: 27 s (ref 24–33)

## 2018-03-15 LAB — TSH: TSH: 1.55 u[IU]/mL (ref 0.450–4.500)

## 2018-03-15 LAB — PROLACTIN: Prolactin: 0.2 ng/mL — ABNORMAL LOW (ref 4.8–23.3)

## 2018-03-16 LAB — CYTOLOGY - PAP
Diagnosis: NEGATIVE
HPV: NOT DETECTED

## 2018-03-17 ENCOUNTER — Encounter: Payer: Self-pay | Admitting: Obstetrics & Gynecology

## 2018-03-17 ENCOUNTER — Telehealth: Payer: Self-pay | Admitting: Obstetrics & Gynecology

## 2018-03-17 NOTE — Telephone Encounter (Signed)
Patient sent the following correspondence through [place]. Routing to triage to assist patient with request.  1.When should labs be back?  2.Hematoma is right thigh (inside that big bruise is getting bigger. Is that normal?  3. Can you see mammogram results from last March? I never got anything. Assuming it was normal.  4. FYI-MRI of left knee showed a meniscus tear and I am scheduled for surgery Oct. 2.    Thank you!  Darlene Walker  11/25/1960  [phone]

## 2018-03-17 NOTE — Telephone Encounter (Signed)
[facility] MMG dated 09/11/17 - negative, screening MMG 1 yr, results scanned in Epic.  Routing to Dr. Sabra Heck to review labs dated 03/14/18 and advise. Please advise on hematoma f/u?

## 2018-03-18 ENCOUNTER — Encounter: Payer: Self-pay | Admitting: Obstetrics & Gynecology

## 2018-03-18 NOTE — Telephone Encounter (Signed)
Called pt personally.  Results given.  She is going to try and find her recent prolactin levels to see if the current level is significantly decreased.  She is going to send me a message in [place].  Ok to close encounter.

## 2018-03-22 ENCOUNTER — Telehealth: Payer: Self-pay | Admitting: Obstetrics & Gynecology

## 2018-03-22 NOTE — Telephone Encounter (Signed)
Patient sent the following correspondence through [place]. Routing to triage to assist patient with request.  I spoke to the nurse at dr. Loren Racer office. She said she would send you last year's test results. He was off yesterday and today but will be back in the office tomorrow. I asked her to please have him take a look at it and either consult with you or call me back.  ----- Message -----  From: Megan Salon, MD  Sent: 03/20/2018 8:47 PM EDT  To: Osa Craver  Subject: RE: Visit Follow-Up Question  Would you give me an update tomorrow after you see him? Thanks.    Edwinna Areola      ----- Message -----   From: Osa Craver   Sent: 03/18/2018 9:50 PM EDT    To: Megan Salon, MD  Subject: Visit Follow-Up Question    I found the 2018 labs and do not see Prolactin on there. However, 2013 and 2014 the number was 0.3. So maybe it's not that far off. We'll see what [facility] thinks Monday.   Thank you so much for your time!  Alan Ripper

## 2018-03-22 NOTE — Telephone Encounter (Signed)
Results received and placed on Dr. Ammie Ferrier desk for review.   Routing to Dr. Sabra Heck

## 2018-03-22 NOTE — Telephone Encounter (Signed)
Her lab work was all normal except her triglycerides were 281 (normal is less than 150).  She may not have been fasting for the test and this would explain that elevation.  Elevated triglycerides would not cause any of her symptoms.  Did have have any thought retarding her prolactin level?

## 2018-03-23 NOTE — Telephone Encounter (Signed)
Spoke with patient, advised as seen below per Dr. Sabra Heck. Patient states Dr. Osborne Casco was out of the office, patient will f/u today and return call with update regarding prolactin level. Patient verbalizes understanding.

## 2018-03-29 HISTORY — PX: KNEE ARTHROSCOPY W/ MENISCECTOMY: SHX1879

## 2018-04-07 ENCOUNTER — Ambulatory Visit (HOSPITAL_COMMUNITY)
Admission: RE | Admit: 2018-04-07 | Discharge: 2018-04-07 | Disposition: A | Discharge status: Home / Self Care | Payer: BC Managed Care – PPO | Source: Ambulatory Visit | Attending: Orthopedic Surgery | Admitting: Orthopedic Surgery

## 2018-04-07 ENCOUNTER — Other Ambulatory Visit (HOSPITAL_COMMUNITY): Payer: Self-pay | Admitting: Orthopedic Surgery

## 2018-04-07 DIAGNOSIS — M7989 Other specified soft tissue disorders: Principal | ICD-10-CM

## 2018-04-07 DIAGNOSIS — M79605 Pain in left leg: Secondary | ICD-10-CM | POA: Diagnosis present

## 2018-04-07 NOTE — Progress Notes (Signed)
Left lower extremity venous duplex has been completed. Negative for DVT. Results were given to [place] at Dr. Alvester Morin office.  04/07/18 2:26 PM Carlos Levering RVT

## 2018-04-07 NOTE — Telephone Encounter (Signed)
Ok to close encounter. 

## 2018-04-07 NOTE — Telephone Encounter (Signed)
Dr. Sabra Heck -patient has not returned call, any additional f/u needed? Or ok to close encounter?

## 2018-04-08 NOTE — Telephone Encounter (Signed)
Encounter closed

## 2019-01-18 ENCOUNTER — Encounter: Payer: Self-pay | Admitting: Obstetrics & Gynecology

## 2019-03-30 ENCOUNTER — Other Ambulatory Visit: Payer: Self-pay | Admitting: Obstetrics & Gynecology

## 2019-03-30 NOTE — Telephone Encounter (Signed)
Medication refill request: Pristiq Last AEX:  03/14/18 SM Next AEX: 07/14/19 Last MMG (if hormonal medication request): 01/18/19 BIRADS 1 negative Refill authorized: Please advise; Order pended #90 w/1 refill if authorized

## 2019-07-11 NOTE — Progress Notes (Signed)
59 y.o. G0P0 [name] here for annual exam.  Doing well.  Feels well.  Reports she's struggling with exercise due to Covid closures.  Having some left knee pain.  Had meniscus repaired.  Still having pain and "popping" issues.  She's going to have a second opinion.    Denies vaginal bleeding.    PCP:  Dr. Osborne Casco.  Did have a virtual visit.  Did have blood work done.  Had a flu shot.    Patient's last menstrual period was 03/30/2011.          Sexually active: No.  The current method of family planning is post menopausal status.    Exercising: No.  The patient does not participate in regular exercise at present. Smoker:  no  Health Maintenance: Pap:  03-14-18 negative, HR HPV negative          03/11/17 Negative  History of abnormal Pap:  no MMG: 01/18/19-BIRADS 1, Negative   Colonoscopy:  05/29/15 polyp. F/u 5 years.  Knows this is due end of the year BMD:   2015 PCP TDaP:  2018 with Dr. Osborne Casco Pneumonia vaccine(s):  n/a Shingrix: completed x2 at [facility] testing: 12/02/15 Neg  Screening Labs: PCP   reports that she has never smoked. She has never used smokeless tobacco. She reports current alcohol use. She reports that she does not use drugs.  Past Medical History:  Diagnosis Date  . Allergy   . Alopecia areata   . Arthritis   . Dysmenorrhea   . Graves' disease 1990  . Hyperlipidemia    with High Small Particle LDL  . IBS (irritable bowel syndrome)   . Leukopenia   . Microprolactinoma ([employer])   . Spondylolisthesis of lumbar region   . Syringomyelia ([place]) 2003  . Thyroid disease    hx of, no meds now    Past Surgical History:  Procedure Laterality Date  . BUNIONECTOMY  07/2013   left foot  . COLONOSCOPY  11/2010  . TONSILLECTOMY AND ADENOIDECTOMY  1968    Current Outpatient Medications  Medication Sig Dispense Refill  . aspirin 81 MG tablet Take 81 mg by mouth daily.      . Biotin 10 MG CAPS Take by mouth daily.    . cabergoline  (DOSTINEX) 0.5 MG tablet 2 (two) times a week.     . desvenlafaxine (PRISTIQ) 50 MG 24 hr tablet TAKE 1 TABLET(50 MG) BY MOUTH DAILY 90 tablet 1  . DEXILANT 60 MG capsule Take 1 capsule by mouth daily.  4  . simvastatin (ZOCOR) 40 MG tablet 1 tablet Daily.     No current facility-administered medications for this visit.    Family History  Problem Relation Age of Onset  . Hypertension Father   . Heart disease Father   . Heart disease Mother   . Colon cancer Neg Hx     Review of Systems  All other systems reviewed and are negative.   Exam:   BP 112/70 (BP Location: Left Arm, Patient Position: Sitting, Cuff Size: Normal)   Pulse 76   Temp (!) 97 F (36.1 C) (Skin)   Resp 14   Ht 5\' 6"  (1.676 m)   Wt 158 lb 1.6 oz (71.7 kg)   LMP 03/30/2011   BMI 25.52 kg/m    Height: 5\' 6"  (167.6 cm)  Ht Readings from Last 3 Encounters:  07/14/19 5\' 6"  (1.676 m)  03/14/18 5' 5.75" (1.67 m)  11/26/17 5\' 3"  (  1.6 m)   General appearance: alert, cooperative and appears stated age Head: Normocephalic, without obvious abnormality, atraumatic Neck: no adenopathy, supple, symmetrical, trachea midline and thyroid normal to inspection and palpation Lungs: clear to auscultation bilaterally Breasts: normal appearance, no masses or tenderness Heart: regular rate and rhythm Abdomen: soft, non-tender; bowel sounds normal; no masses,  no organomegaly Extremities: extremities normal, atraumatic, no cyanosis or edema Skin: Skin color, texture, turgor normal. No rashes or lesions Lymph nodes: Cervical, supraclavicular, and axillary nodes normal. No abnormal inguinal nodes palpated Neurologic: Grossly normal   Pelvic: External genitalia:  Mild hypopigmentation of inner labia majora              Urethra:  normal appearing urethra with no masses, tenderness or lesions              Bartholins and Skenes: normal                 Vagina: normal appearing vagina with normal color and discharge, no lesions               Cervix: no lesions              Pap taken: No. Bimanual Exam:  Uterus:  normal size, contour, position, consistency, mobility, non-tender              Adnexa: normal adnexa and no mass, fullness, tenderness               Rectovaginal: Confirms               Anus:  normal sphincter tone, no lesions  Chaperone, Karmen Bongo, RN, was present for exam.  A:  Well Woman with normal exam PMP, no HRT H/o microprolactinoma H/o Grave's disease Colon polyps Left knee pain  Very mild hypopigmentation of inner labia majora with no skin changes.  (pt advised to call with any itching)   P:   Mammogram guidelines reviewed pap smear with neg HR HPV 2019 RF for prestiq 50mg  daily.  #90/4RF Lab work done with Dr. Osborne Casco in 2020 She will call back and let me know about date for tdap and shingrix Pt is aware colonoscopy due later in the year Return annually or prn

## 2019-07-12 ENCOUNTER — Other Ambulatory Visit: Payer: Self-pay

## 2019-07-14 ENCOUNTER — Other Ambulatory Visit: Payer: Self-pay

## 2019-07-14 ENCOUNTER — Ambulatory Visit: Payer: BC Managed Care – PPO | Admitting: Obstetrics & Gynecology

## 2019-07-14 ENCOUNTER — Encounter: Payer: Self-pay | Admitting: Obstetrics & Gynecology

## 2019-07-14 VITALS — BP 112/70 | HR 76 | Temp 97.0°F | Resp 14 | Ht 66.0 in | Wt 158.1 lb

## 2019-07-14 DIAGNOSIS — Z01419 Encounter for gynecological examination (general) (routine) without abnormal findings: Secondary | ICD-10-CM

## 2019-07-14 MED ORDER — DESVENLAFAXINE SUCCINATE ER 50 MG PO TB24: ORAL_TABLET | ORAL | 4 refills | Status: DC

## 2019-07-30 ENCOUNTER — Encounter: Payer: Self-pay | Admitting: Obstetrics & Gynecology

## 2019-07-31 ENCOUNTER — Telehealth: Payer: Self-pay | Admitting: Obstetrics & Gynecology

## 2019-07-31 NOTE — Telephone Encounter (Signed)
Spoke to pt. Pt states having Covid sx since partner tested positive on Sat. Pt calling to ask about body rash she started having. Pt states tested negative for Covid on Sat. Pt advised might have been too early to test because now having sx of headache, sore throat, runny nose and cough and now body rash. Pt states feeling ok and wanting to know what to put on itchy, bumpy rash on arms and legs? Pt instructed to use hydrocortisone cream and call into PCP. Pt agreeable. Pt also advised to get retested by Wed this week to see if positive. Pt agreeable.   Routing to Dr Sabra Heck for review and any advice on rash cream? Pt states can write back on mychart.

## 2019-07-31 NOTE — Telephone Encounter (Signed)
Spoke to pt. Pt given recommendations per Dr Sabra Heck. Pt thankful for advice.   Encounter closed.

## 2019-07-31 NOTE — Telephone Encounter (Signed)
Avveno baths may help.  Hydrocortisone cream/ointment is good too.  Can use topical benadryl cream as well.  Thanks.

## 2019-07-31 NOTE — Telephone Encounter (Signed)
Non-Urgent Medical Question Received: Yesterday Message Contents  [place], [place] sent to [facility]  Phone Number: [phone]  Dr. Sabra Heck-  Darlene Walker ([birth year]) and I (02/24/1961) both got Covid tests yesterday. Hers came back positive, mine negative. I have sent Dr. Osborne Casco a message as well but we wanted to let you know and see of you had advice.  Since we live together and have been together nonstop I am guessing I am going to test positive soon.  Her symptoms-congestion, sneezing, runny nose, loss of taste and smell  Mine-headache, sore throat, runny nose, cough  No fever or chills for either  No known exposure to anyone and have been masking and washing constantly. No restaurants since March, etc.  Kim doesn't think she has a My Chart so we are writing this together!  Any advice appreciated.  [phone]

## 2019-08-10 ENCOUNTER — Ambulatory Visit: Payer: BC Managed Care – PPO

## 2019-08-26 ENCOUNTER — Ambulatory Visit: Payer: BC Managed Care – PPO | Attending: Internal Medicine

## 2019-08-26 DIAGNOSIS — Z23 Encounter for immunization: Secondary | ICD-10-CM | POA: Insufficient documentation

## 2019-08-26 NOTE — Progress Notes (Signed)
   Covid-19 Vaccination Clinic  Name:  Darlene Walker    MRN: [id number] DOB: Jul 15, 1960  08/26/2019  Ms. Sholley was observed post Covid-19 immunization for 15 minutes without incidence. She was provided with Vaccine Information Sheet and instruction to access the V-Safe system.   Ms. Bozeman was instructed to call 911 with any severe reactions post vaccine: Marland Kitchen Difficulty breathing  . Swelling of your face and throat  . A fast heartbeat  . A bad rash all over your body  . Dizziness and weakness    Immunizations Administered    Name Date Dose VIS Date Route   Pfizer COVID-19 Vaccine 08/26/2019 10:03 AM 0.3 mL 06/09/2019 Intramuscular   Manufacturer: [place]   Lot: [id number]   [place]: [id number]

## 2019-09-16 ENCOUNTER — Encounter: Payer: Self-pay | Admitting: Obstetrics & Gynecology

## 2019-09-18 ENCOUNTER — Telehealth: Payer: Self-pay

## 2019-09-18 NOTE — Telephone Encounter (Signed)
Pt sent following mychart message:    When I was there you said to let you know if anything changed. I have had a little itching and a little burning, both intermittent. Should I come in ? You mentioned maybe a biopsy to be sure? [place] [phone] [email or web address]   Spoke to pt. Pt states having intermittent itching and burning and was told to call to Dr Sabra Heck when this occurred per AEX on 07/14/2019. Pt scheduled for OV with Dr Sabra Heck on 09/21/2019 at 4:30 pm and possible vuvlar bx. Pt agreeable and verbalized understanding.   Routing to Dr Sabra Heck for review and any additional recommendations.

## 2019-09-20 ENCOUNTER — Other Ambulatory Visit: Payer: Self-pay

## 2019-09-20 ENCOUNTER — Ambulatory Visit: Payer: BC Managed Care – PPO | Attending: Internal Medicine

## 2019-09-20 DIAGNOSIS — Z23 Encounter for immunization: Secondary | ICD-10-CM

## 2019-09-20 NOTE — Progress Notes (Signed)
   Covid-19 Vaccination Clinic  Name:  Darlene Walker    MRN: [id number] DOB: 09/25/1960  09/20/2019  [name] was observed post Covid-19 immunization for 15 minutes without incident. She was provided with Vaccine Information Sheet and instruction to access the V-Safe system.   [name] was instructed to call 911 with any severe reactions post vaccine: Marland Kitchen Difficulty breathing  . Swelling of face and throat  . A fast heartbeat  . A bad rash all over body  . Dizziness and weakness   Immunizations Administered    Name Date Dose VIS Date Route   Pfizer COVID-19 Vaccine 09/20/2019 10:43 AM 0.3 mL 06/09/2019 Intramuscular   Manufacturer: [place]   Lot: [id number]   [place]: [id number]

## 2019-09-21 ENCOUNTER — Ambulatory Visit: Payer: BC Managed Care – PPO | Admitting: Obstetrics & Gynecology

## 2019-09-21 ENCOUNTER — Other Ambulatory Visit (HOSPITAL_COMMUNITY)
Admission: RE | Admit: 2019-09-21 | Discharge: 2019-09-21 | Disposition: A | Discharge status: Home / Self Care | Payer: BC Managed Care – PPO | Source: Ambulatory Visit | Attending: Obstetrics & Gynecology | Admitting: Obstetrics & Gynecology

## 2019-09-21 ENCOUNTER — Encounter: Payer: Self-pay | Admitting: Obstetrics & Gynecology

## 2019-09-21 VITALS — BP 118/60 | HR 80 | Temp 96.8°F | Resp 10 | Ht 66.0 in | Wt 156.0 lb

## 2019-09-21 DIAGNOSIS — L292 Pruritus vulvae: Secondary | ICD-10-CM | POA: Diagnosis not present

## 2019-09-21 MED ORDER — CLOBETASOL PROPIONATE 0.05 % EX OINT: 1.0000 "application " | TOPICAL_OINTMENT | Freq: Two times a day (BID) | CUTANEOUS | 0 refills | Status: DC

## 2019-09-21 NOTE — Progress Notes (Signed)
GYNECOLOGY  VISIT  CC:   Patient complains of vulvar burning and itching  HPI: 59 y.o. G0P0 [name] here for vulvar itching.  Patient was seen for AEX 07/14/2019 and mild hypopigmentation of inner labia majora was noted.  This was discussed with pt but she was not having any symptoms.  We discussed if she started to have any symptoms, she should call for possible biopsy.  This week, she called reporting having a change with vulvar itching and burning.    GYNECOLOGIC HISTORY: Patient's last menstrual period was 03/30/2011. Contraception: Postmenopausal Menopausal hormone therapy: none  Patient Active Problem List   Diagnosis Date Noted  . Hx of adenomatous polyp of colon 05/09/2015  . 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 Allergy   . Alopecia areata   . Arthritis   . Dysmenorrhea   . Graves' disease 1990  . Hyperlipidemia    with High Small Particle LDL  . IBS (irritable bowel syndrome)   . Leukopenia   . 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 Spondylolisthesis of lumbar region   . Syringomyelia ([place]) 2003  . Thyroid disease    hx of, no meds now    Past Surgical History:  Procedure Laterality Date  . BUNIONECTOMY  07/2013   left foot  . COLONOSCOPY  11/2010  . KNEE ARTHROSCOPY W/ MENISCECTOMY Left 03/2018  . TONSILLECTOMY AND ADENOIDECTOMY  1968    MEDS:   Current Outpatient Medications on File Prior to Visit  Medication Sig Dispense Refill  . aspirin 81 MG tablet Take 81 mg by mouth daily.      . Biotin 10 MG CAPS Take by mouth daily.    . cabergoline (DOSTINEX) 0.5 MG tablet 2 (two) times a week.     . desvenlafaxine (PRISTIQ) 50 MG 24 hr tablet TAKE 1 TABLET(50 MG) BY MOUTH DAILY 90 tablet 4  . DEXILANT 60 MG capsule Take 1 capsule by mouth daily.  4  . simvastatin (ZOCOR) 40 MG tablet 1 tablet Daily.     No current facility-administered medications on file prior to visit.    ALLERGIES: Patient has no  known allergies.  Family History  Problem Relation Age of Onset  . Hypertension Father   . Heart disease Father   . Heart disease Mother   . Colon cancer Neg Hx     SH:  Single, non smoker  Review of Systems  Genitourinary:       Vulvar burning Vulvar itching  All other systems reviewed and are negative.   PHYSICAL EXAMINATION:    BP 118/60 (BP Location: Right Arm, Patient Position: Sitting, Cuff Size: Normal)   Pulse 80   Temp (!) 96.8 F (36 C) (Temporal)   Resp 10   Ht 5\' 6"  (1.676 m)   Wt 156 lb (70.8 kg)   LMP 03/30/2011   BMI 25.18 kg/m     General appearance: alert, cooperative and appears stated age Lymph:  no inguinal LAD noted  Pelvic: External genitalia:  Inner labia with hypopigmentation and thickness of skin which is much more pronounced since her visit in 2022/08/09              Urethra:  normal appearing urethra with no masses, tenderness or lesions              Bartholins and Skenes: normal                 Vagina:  normal appearing vagina with normal color and discharge, no lesions               Biopsy is recommended today.    Procedure:  Area cleansed with Betadine.  Sterile technique used throughout procedure.  Skin anesthestized with Lidocaine 1% plain; 3mL. 3 punch biopsy used to obtain specimen.  Biopsy grasped with pick-ups and excised with scissors.  Adequate hemostasis obtained with silver nitrate sticks.  Dressing was not applied.  Pt tolerated procedure well.  Chaperone, Terence Lux, CMA, was present for exam.  Assessment: Vulvar skin changes c/w lichen sclerosus that has changed since prior exam  Plan: Biopsy obtained today.  Pathology results will be called to pt.  She is going to start clobetasol 0.05% BID.  Will plan follow up after results are finalized.

## 2019-09-25 LAB — SURGICAL PATHOLOGY

## 2019-10-13 ENCOUNTER — Telehealth: Payer: Self-pay | Admitting: *Deleted

## 2019-10-13 ENCOUNTER — Encounter: Payer: Self-pay | Admitting: Obstetrics & Gynecology

## 2019-10-13 NOTE — Telephone Encounter (Signed)
My Chart message from patient:  Happy Friday! I have been using the Clobetasol ointment for 22 days. I honestly don't really see or feel much difference. Still have a little burning/itching. It is soothing and helps short term.  The last couple weeks I have been having headaches. 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 This morning I woke up at 6 nauseous and really bad headache. Took some Advil, put ice on my head and neck (usually helps) and  tried to go back to sleep. I threw up about 7, took some phenergan, laid down and now I feel pretty good. I haven't had headaches like this for many years!  Any chance this is related to the Clobetasol? I looked up side effects and it said to let your dr know if you had headaches.  Should I keep using until  my appointment on the 27th? Thank you! Darlene Walker

## 2019-10-13 NOTE — Telephone Encounter (Signed)
Call to patient regarding My Chart message.  Reports clobetasol provides initial comfort when applied but only lasts a few hours. Overall, no significant improvement. Gradual increase in headaches that progressed to migraine today. Feels may be related to clobetasol.  Advised to discontinue and see if headaches improve.  If headaches continue or worsen, should seek care at urgent care or ED. If do not improve with discontinue of clobetasol, needs PCP follow-up. Currently scheduled for follow-up on 10-24-19. Moved appointment up to 10-18-19 with Dr Sabra Heck to discuss alternatives.  Advised Dr Sabra Heck will review call. Will call back if any change in recommendations.

## 2019-10-17 ENCOUNTER — Other Ambulatory Visit: Payer: Self-pay

## 2019-10-18 ENCOUNTER — Ambulatory Visit (INDEPENDENT_AMBULATORY_CARE_PROVIDER_SITE_OTHER): Payer: BC Managed Care – PPO | Admitting: Obstetrics & Gynecology

## 2019-10-18 ENCOUNTER — Encounter: Payer: Self-pay | Admitting: Obstetrics & Gynecology

## 2019-10-18 VITALS — BP 114/62 | HR 88 | Temp 98.5°F | Ht 66.0 in | Wt 155.2 lb

## 2019-10-18 DIAGNOSIS — L900 Lichen sclerosus et atrophicus: Secondary | ICD-10-CM

## 2019-10-18 MED ORDER — TRIAMCINOLONE ACETONIDE 0.1 % EX OINT: TOPICAL_OINTMENT | CUTANEOUS | 2 refills | Status: DC

## 2019-10-18 NOTE — Patient Instructions (Signed)
Triamcinolone ointment 0.1%

## 2019-10-18 NOTE — Progress Notes (Signed)
GYNECOLOGY  VISIT  CC:   Recheck lichen sclerosus  HPI: 59 y.o. G0P0 [name] here for follow up after having biopsy showing lichen sclerosus.  Reports the steroid soothed when she put it on but the symptoms continues.  She called on 10/13/2019 and stopped the steroid then.  Now, she is feeling improved.  Itching and burning have resolved.  Denies vaginal bleeding or discharge.  Not really sure what to do at this point.   GYNECOLOGIC HISTORY: Patient's last menstrual period was 03/30/2011. Contraception: PMP Menopausal hormone therapy: none  Patient Active Problem List   Diagnosis Date Noted  . Hx of adenomatous polyp of colon 05/09/2015  . 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 Allergy   . Alopecia areata   . Arthritis   . Dysmenorrhea   . Graves' disease 1990  . Hyperlipidemia    with High Small Particle LDL  . IBS (irritable bowel syndrome)   . Leukopenia   . 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 Spondylolisthesis of lumbar region   . Syringomyelia ([place]) 2003  . Thyroid disease    hx of, no meds now    Past Surgical History:  Procedure Laterality Date  . BUNIONECTOMY  07/2013   left foot  . COLONOSCOPY  11/2010  . KNEE ARTHROSCOPY W/ MENISCECTOMY Left 03/2018  . TONSILLECTOMY AND ADENOIDECTOMY  1968    MEDS:   Current Outpatient Medications on File Prior to Visit  Medication Sig Dispense Refill  . aspirin 81 MG tablet Take 81 mg by mouth daily.      . Biotin 10 MG CAPS Take by mouth daily.    . cabergoline (DOSTINEX) 0.5 MG tablet 2 (two) times a week.     . desvenlafaxine (PRISTIQ) 50 MG 24 hr tablet TAKE 1 TABLET(50 MG) BY MOUTH DAILY 90 tablet 4  . DEXILANT 60 MG capsule Take 1 capsule by mouth daily.  4  . simvastatin (ZOCOR) 40 MG tablet 1 tablet Daily.     No current facility-administered medications on file prior to visit.    ALLERGIES: Patient has no known allergies.  Family History  Problem  Relation Age of Onset  . Hypertension Father   . Heart disease Father   . Heart disease Mother   . Colon cancer Neg Hx     SH:  Single, non smoker  Review of Systems  All other systems reviewed and are negative.   PHYSICAL EXAMINATION:    BP 114/62 (BP Location: Right Arm, Patient Position: Sitting, Cuff Size: Normal)   Pulse 88   Temp 98.5 F (36.9 C) (Temporal)   Ht 5\' 6"  (1.676 m)   Wt 155 lb 3.2 oz (70.4 kg)   LMP 03/30/2011   BMI 25.05 kg/m     General appearance: alert, cooperative and appears stated age Lymph:  no inguinal LAD noted  Pelvic: External genitalia:  Improvement in inner labia hypopigmentation and skin thickening, tissue is almost back to normal in appearance with more normal appearing pink skin today              Urethra:  normal appearing urethra with no masses, tenderness or lesions              Bartholins and Skenes: normal                 Vagina: normal appearing vagina with normal color and discharge, no lesions  Anus:  no lesions  Chaperone, Terence Lux, CMA, was present for exam.  Assessment: Lichen sclerosus, improved after steroid use  Plan: D/w pt I feel treatment did help as skin is improved and now that she has stopped, symptoms are resolved.  Feel she should use maintenance therapy with triamcinolone 0.15 topically (ointment) no more than twice weekly.  Rx to pharmacy.  She is going to call if has any worsening of symptoms.   ~20 minutes total spent with pt in discussion of typical course of disease, treatment, maintenance, follow up.

## 2019-10-24 ENCOUNTER — Ambulatory Visit: Payer: Self-pay | Admitting: Obstetrics & Gynecology

## 2020-02-06 ENCOUNTER — Encounter: Payer: Self-pay | Admitting: Obstetrics & Gynecology

## 2020-03-29 HISTORY — PX: MENISCUS REPAIR: SHX5179

## 2020-06-04 DIAGNOSIS — R42 Dizziness and giddiness: Secondary | ICD-10-CM | POA: Insufficient documentation

## 2020-06-04 NOTE — Progress Notes (Signed)
Patient referred by Haywood Pao, MD for presyncope  Subjective:   Darlene Walker, female    DOB: 09/08/1960, 59 y.o.   MRN: [id number]   Chief Complaint  Patient presents with  . Loss of Consciousness    pt c/o tingle in her hands and feet  . New Patient (Initial Visit)    surgery 07/17/19  . Pre-op Exam     HPI  59 y.o. Caucasian female with family history of early CAD, mild hyperlipidemia, now with presyncope.  Patient was recently at [facility] for [employer] consult.  She was sitting in a cramped seating, and had pain in her knees owing to her underlying meniscus tear.  1-1/2-hour into the consult, she suddenly felt lightheaded, diaphoretic, had tunnel vision and could not hear the music.  Following this, she had floaters in front of her eyes.  She rested her head down.  Her cousin sitting next to her gave her Sprite to drink.  Patient symptoms resolved in the next couple of minutes.  She did not lose consciousness.  She did not have any recurrence of the symptoms rest of the evening.  She was able to walk to her car without any difficulty.  At baseline, patient does not do any regular exercise owing to her meniscus tear.  However, she stays active with school, church, and other activities.  She has a flight of about 15-20 stairs in her home that she takes multiple times without any complaints of chest pain or shortness of breath.  Patient is a retired Radio producer.  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  Patient is strong family history of coronary artery disease with her mother having had her first MI at 23, father having had first MI in 11s.  She is a lifetime non-smoker.  She is on low-dose simvastatin for mild hyperlipidemia.   Past Medical History:  Diagnosis Date  . Allergy   . Alopecia areata   . Arthritis   . Dysmenorrhea   . Graves' disease 1990  . Hyperlipidemia    with High Small Particle LDL  . IBS (irritable bowel  syndrome)   . Leukopenia   . 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 Spondylolisthesis of lumbar region   . Syringomyelia ([place]) 2003  . Thyroid disease    hx of, no meds now     Past Surgical History:  Procedure Laterality Date  . BUNIONECTOMY  07/2013   left foot  . COLONOSCOPY  11/2010  . KNEE ARTHROSCOPY W/ MENISCECTOMY Left 03/2018  . TONSILLECTOMY AND ADENOIDECTOMY  1968     Social History   Tobacco Use  Smoking Status Never Smoker  Smokeless Tobacco Never Used    Social History   Substance and Sexual Activity  Alcohol Use Yes  . Alcohol/week: 0.0 - 1.0 standard drinks     Family History  Problem Relation Age of Onset  . Hypertension Father   . Heart disease Father   . Heart disease Mother   . Hyperlipidemia Sister   . Hyperlipidemia Sister   . Colon cancer Neg Hx      Current Outpatient Medications on File Prior to Visit  Medication Sig Dispense Refill  . aspirin 81 MG tablet Take 81 mg by mouth daily.      . Biotin 10 MG CAPS Take by mouth daily.    . cabergoline (DOSTINEX) 0.5 MG tablet 2 (two) times a week.     . desvenlafaxine (PRISTIQ) 50  MG 24 hr tablet TAKE 1 TABLET(50 MG) BY MOUTH DAILY 90 tablet 4  . DEXILANT 60 MG capsule Take 1 capsule by mouth daily.  4  . simvastatin (ZOCOR) 40 MG tablet 1 tablet Daily.    Marland Kitchen triamcinolone ointment (KENALOG) 0.1 % Apply small amount to affected area twice weekly. 30 g 2   No current facility-administered medications on file prior to visit.    Cardiovascular and other pertinent studies:  EKG 06/05/2020: Sinus rhythm 81 bpm Normal EKG   Recent labs: 05/28/2020: Glucose 88, BUN/Cr 19/0.8. EGFR 73. Na/K 140/5.1. Rest of the CMP normal H/H 12.9/40. MCV 88. Platelets 193 Chol 171, TG 179, HDL 47, LDL 88 TSH 1.9 normal    Review of Systems  Cardiovascular: Negative for chest pain, dyspnea on exertion, leg swelling, palpitations and syncope.       Episode of presyncope, as detailed in HPI  Musculoskeletal:  Positive for joint pain.         Vitals:   06/05/20 0846 06/05/20 0847  SpO2: 96% 98%   Orthostatic VS for the past 72 hrs (Last 3 readings):  Orthostatic BP Patient Position BP Location Cuff Size Orthostatic Pulse  06/05/20 0847 111/71 Standing Left Arm Normal 88  06/05/20 0846 113/61 Sitting Left Arm Normal 81  06/05/20 0842 118/63 Supine Left Arm Normal 80     Body mass index is 24.86 kg/m. Filed Weights   06/05/20 0842  Weight: 154 lb (69.9 kg)     Objective:   Physical Exam Vitals and nursing note reviewed.  Constitutional:      General: She is not in acute distress. Neck:     Vascular: No JVD.  Cardiovascular:     Rate and Rhythm: Normal rate and regular rhythm.     Pulses: Normal pulses.     Heart sounds: Normal heart sounds. No murmur heard.   Pulmonary:     Effort: Pulmonary effort is normal.     Breath sounds: Normal breath sounds. No wheezing or rales.  Musculoskeletal:     Right lower leg: No edema.     Left lower leg: No edema.            Assessment & Recommendations:   59 y.o. Caucasian female with family history of early CAD, mild hyperlipidemia, now with presyncope.  Presyncope: Episode typical of vasovagal reaction.  Orthostatics negative today.  Low suspicion for cardiac etiology.  Nonetheless, will obtain baseline echocardiogram.  Encourage patient to stay well-hydrated, avoid situations that could trigger her pain, consider wearing compression stockings.  Family history of early CAD: Recommend CT cardiac scoring  Preoperative stratification: Low cardiac risk for noncardiac surgery, unless above 2 tests show any significant abnormalities.  Thank you for referring the patient to [place]. Please feel free to contact with any questions.   Nigel Mormon, MD Pager: [phone] Office: [phone]

## 2020-06-05 ENCOUNTER — Encounter: Payer: Self-pay | Admitting: Cardiology

## 2020-06-05 ENCOUNTER — Ambulatory Visit: Payer: BC Managed Care – PPO | Admitting: Cardiology

## 2020-06-05 ENCOUNTER — Other Ambulatory Visit: Payer: Self-pay

## 2020-06-05 VITALS — Ht 66.0 in | Wt 154.0 lb

## 2020-06-05 DIAGNOSIS — Z8249 Family history of ischemic heart disease and other diseases of the circulatory system: Secondary | ICD-10-CM | POA: Insufficient documentation

## 2020-06-05 DIAGNOSIS — R42 Dizziness and giddiness: Secondary | ICD-10-CM

## 2020-06-05 DIAGNOSIS — R55 Syncope and collapse: Secondary | ICD-10-CM

## 2020-06-13 ENCOUNTER — Encounter: Payer: Self-pay | Admitting: Internal Medicine

## 2020-06-17 ENCOUNTER — Other Ambulatory Visit: Payer: Self-pay

## 2020-06-17 ENCOUNTER — Ambulatory Visit: Payer: BC Managed Care – PPO

## 2020-06-17 DIAGNOSIS — R55 Syncope and collapse: Secondary | ICD-10-CM

## 2020-06-17 DIAGNOSIS — R42 Dizziness and giddiness: Secondary | ICD-10-CM

## 2020-07-16 HISTORY — PX: MENISCUS REPAIR: SHX5179

## 2020-08-07 ENCOUNTER — Encounter: Payer: BC Managed Care – PPO | Admitting: Internal Medicine

## 2020-08-29 ENCOUNTER — Ambulatory Visit (AMBULATORY_SURGERY_CENTER): Payer: Self-pay | Admitting: *Deleted

## 2020-08-29 ENCOUNTER — Other Ambulatory Visit: Payer: Self-pay

## 2020-08-29 VITALS — Ht 66.0 in | Wt 151.0 lb

## 2020-08-29 DIAGNOSIS — Z86010 Personal history of colonic polyps: Secondary | ICD-10-CM

## 2020-08-29 MED ORDER — PLENVU 140 G PO SOLR: 1.0000 | ORAL | 0 refills | Status: DC

## 2020-08-29 NOTE — Progress Notes (Signed)
No egg or soy allergy known to patient  No issues with past sedation with any surgeries or procedures No intubation problems in the past  No FH of Malignant Hyperthermia No diet pills per patient No home 02 use per patient  No blood thinners per patient  Pt denies issues with constipation  No A fib or A flutter  EMMI video to pt or via [place] 19 guidelines implemented in PV today with Pt and RN  Pt is fully vaccinated  for Covid    Plenvu  Coupon given to pt in PV today , Code to Pharmacy and  NO PA's for preps discussed with pt In PV today  Discussed with pt there will be an out-of-pocket cost for prep and that varies from $0 to 70 dollars   Due to the COVID-19 pandemic we are asking patients to follow certain guidelines.  Pt aware of COVID protocols and [facility] guidelines

## 2020-08-31 ENCOUNTER — Other Ambulatory Visit: Payer: Self-pay | Admitting: Obstetrics & Gynecology

## 2020-09-02 ENCOUNTER — Encounter: Payer: Self-pay | Admitting: Internal Medicine

## 2020-09-06 ENCOUNTER — Telehealth: Payer: Self-pay | Admitting: Internal Medicine

## 2020-09-06 NOTE — Telephone Encounter (Signed)
Pt is requesting another prep, pt's insurance will not cover PLENVU

## 2020-09-06 NOTE — Telephone Encounter (Signed)
Explained to pt that Plenvu is Dr.Perry's preference prep. She will take coupon to pharmacy.

## 2020-09-12 ENCOUNTER — Encounter: Payer: Self-pay | Admitting: Internal Medicine

## 2020-09-12 ENCOUNTER — Other Ambulatory Visit: Payer: Self-pay

## 2020-09-12 ENCOUNTER — Ambulatory Visit (AMBULATORY_SURGERY_CENTER): Payer: BC Managed Care – PPO | Admitting: Internal Medicine

## 2020-09-12 VITALS — BP 105/58 | HR 70 | Temp 97.8°F | Resp 13 | Ht 66.0 in | Wt 151.0 lb

## 2020-09-12 DIAGNOSIS — Z86010 Personal history of colonic polyps: Secondary | ICD-10-CM | POA: Diagnosis not present

## 2020-09-12 DIAGNOSIS — D128 Benign neoplasm of rectum: Secondary | ICD-10-CM | POA: Diagnosis not present

## 2020-09-12 MED ORDER — SODIUM CHLORIDE 0.9 % IV SOLN: 500.0000 mL | Freq: Once | INTRAVENOUS | Status: DC

## 2020-09-12 NOTE — Progress Notes (Signed)
No problems noted in the recovery room. maw 

## 2020-09-12 NOTE — Progress Notes (Signed)
Pt's states no medical or surgical changes since previsit or office visit. 

## 2020-09-12 NOTE — Progress Notes (Signed)
Called to room to assist during endoscopic procedure.  Patient ID and intended procedure confirmed with present staff. Received instructions for my participation in the procedure from the performing physician.  

## 2020-09-12 NOTE — Op Note (Signed)
[facility] Patient Name: Darlene Walker Procedure Date: 09/12/2020 8:26 AM MRN: [id number] Endoscopist: Docia Chuck. Henrene Pastor , MD Age: 60 Referring MD:  Date of Birth: 04-11-1961 Gender: Female Account #: [id number] Procedure:                Colonoscopy with cold snare polypectomy x 1 Indications:              High risk colon cancer surveillance: Personal                            history of sessile serrated colon polyp (less than                            10 mm in size) with no dysplasia. Previous                            examinations 2012 and 2016 Medicines:                Monitored Anesthesia Care Procedure:                Pre-Anesthesia Assessment:                           - Prior to the procedure, a History and Physical                            was performed, and patient medications and                            allergies were reviewed. The patient's tolerance of                            previous anesthesia was also reviewed. The risks                            and benefits of the procedure and the sedation                            options and risks were discussed with the patient.                            All questions were answered, and informed consent                            was obtained. Prior Anticoagulants: The patient has                            taken no previous anticoagulant or antiplatelet                            agents. ASA Grade Assessment: I - A normal, healthy                            patient. After reviewing the risks and benefits,  the patient was deemed in satisfactory condition to                            undergo the procedure.                           After obtaining informed consent, the colonoscope                            was passed under direct vision. Throughout the                            procedure, the patient's blood pressure, pulse, and                            oxygen saturations were  monitored continuously. The                            Olympus CF-[id number]) Colonoscope was                            introduced through the anus and advanced to the the                            cecum, identified by appendiceal orifice and                            ileocecal valve. The ileocecal valve, appendiceal                            orifice, and rectum were photographed. The quality                            of the bowel preparation was excellent. The                            colonoscopy was performed without difficulty. The                            patient tolerated the procedure well. The bowel                            preparation used was Prepopik via split dose                            instruction. Scope In: 8:48:38 AM Scope Out: 9:05:49 AM Scope Withdrawal Time: 0 hours 11 minutes 46 seconds  Total Procedure Duration: 0 hours 17 minutes 11 seconds  Findings:                 A 1 mm polyp was found in the rectum. The polyp was                            sessile. The polyp was removed with a cold snare.  Resection was complete. No meaningful tissue for                            pathologic summation.                           The exam was otherwise without abnormality on                            direct and retroflexion views. Complications:            No immediate complications. Estimated blood loss:                            None. Estimated Blood Loss:     Estimated blood loss: none. Impression:               - One 1 mm polyp in the rectum, removed with a cold                            snare. Resected and retrieved.                           - The examination was otherwise normal on direct                            and retroflexion views. Recommendation:           - Repeat colonoscopy in 7 years for surveillance.                           - Patient has a contact number available for                            emergencies. The  signs and symptoms of potential                            delayed complications were discussed with the                            patient. Return to normal activities tomorrow.                            Written discharge instructions were provided to the                            patient.                           - Resume previous diet.                           - Continue present medications. Docia Chuck. Henrene Pastor, MD 09/12/2020 9:14:51 AM This report has been signed electronically.

## 2020-09-12 NOTE — Progress Notes (Signed)
PT taken to PACU. Monitors in place. VSS. Report given to RN. 

## 2020-09-12 NOTE — Patient Instructions (Addendum)
One tiny polyp was removed from your rectum.  Unable to retrieve the polyp to send to pathology.  Per Dr. Henrene Pastor repeat colonoscopy in 7 years for history of colon polyps. You may resume your current medications today. Please call if any questions or concerns.    YOU HAD AN ENDOSCOPIC PROCEDURE TODAY AT [facility]:   Refer to the procedure report that was given to you for any specific questions about what was found during the examination.  If the procedure report does not answer your questions, please call your gastroenterologist to clarify.  If you requested that your care partner not be given the details of your procedure findings, then the procedure report has been included in a sealed envelope for you to review at your convenience later.  YOU SHOULD EXPECT: Some feelings of bloating in the abdomen. Passage of more gas than usual.  Walking can help get rid of the air that was put into your GI tract during the procedure and reduce the bloating. If you had a lower endoscopy (such as a colonoscopy or flexible sigmoidoscopy) you may notice spotting of blood in your stool or on the toilet paper. If you underwent a bowel prep for your procedure, you may not have a normal bowel movement for a few days.  Please Note:  You might notice some irritation and congestion in your nose or some drainage.  This is from the oxygen used during your procedure.  There is no need for concern and it should clear up in a day or so.  SYMPTOMS TO REPORT IMMEDIATELY:   Following lower endoscopy (colonoscopy or flexible sigmoidoscopy):  Excessive amounts of blood in the stool  Significant tenderness or worsening of abdominal pains  Swelling of the abdomen that is new, acute  Fever of 100F or higher   For urgent or emergent issues, a gastroenterologist can be reached at any hour by calling [phone]. Do not use MyChart messaging for urgent concerns.    DIET:  We do recommend a small meal at  first, but then you may proceed to your regular diet.  Drink plenty of fluids but you should avoid alcoholic beverages for 24 hours.  ACTIVITY:  You should plan to take it easy for the rest of today and you should NOT DRIVE or use heavy machinery until tomorrow (because of the sedation medicines used during the test).    FOLLOW UP: Our staff will call the number listed on your records 48-72 hours following your procedure to check on you and address any questions or concerns that you may have regarding the information given to you following your procedure. If we do not reach you, we will leave a message.  We will attempt to reach you two times.  During this call, we will ask if you have developed any symptoms of COVID 19. If you develop any symptoms (ie: fever, flu-like symptoms, shortness of breath, cough etc.) before then, please call [phone].  If you test positive for Covid 19 in the 2 weeks post procedure, please call and report this information to [place].    If any biopsies were taken you will be contacted by phone or by letter within the next 1-3 weeks.  Please call us at [phone] if you have not heard about the biopsies in 3 weeks.    SIGNATURES/CONFIDENTIALITY: You and/or your care partner have signed paperwork which will be entered into your electronic medical record.  These signatures attest to the fact that  that the information above on your After Visit Summary has been reviewed and is understood.  Full responsibility of the confidentiality of this discharge information lies with you and/or your care-partner.

## 2020-09-16 ENCOUNTER — Telehealth: Payer: Self-pay | Admitting: *Deleted

## 2020-09-16 NOTE — Telephone Encounter (Signed)
Left message on f/u call 

## 2020-09-16 NOTE — Telephone Encounter (Signed)
  Follow up Call-  Call back number 09/12/2020  Post procedure Call Back phone  # [phone]  Permission to leave phone message Yes  Some recent data might be hidden     Patient questions:  Do you have a fever, pain , or abdominal swelling? No. Pain Score  0 *  Have you tolerated food without any problems? Yes.    Have you been able to return to your normal activities? Yes.    Do you have any questions about your discharge instructions: Diet   No. Medications  No. Follow up visit  No.  Do you have questions or concerns about your Care? No.  Actions: * If pain score is 4 or above: No action needed, pain <4.   1. Have you developed a fever since your procedure? no  2.   Have you had an respiratory symptoms (SOB or cough) since your procedure? no  3.   Have you tested positive for COVID 19 since your procedure no  4.   Have you had any family members/close contacts diagnosed with the COVID 19 since your procedure?  no   If yes to any of these questions please route to Joylene John, RN and Joella Prince, RN

## 2020-09-27 ENCOUNTER — Ambulatory Visit: Payer: BC Managed Care – PPO

## 2020-10-29 ENCOUNTER — Ambulatory Visit (HOSPITAL_BASED_OUTPATIENT_CLINIC_OR_DEPARTMENT_OTHER): Payer: BC Managed Care – PPO | Admitting: Obstetrics & Gynecology

## 2020-10-30 ENCOUNTER — Other Ambulatory Visit (HOSPITAL_COMMUNITY)
Admission: RE | Admit: 2020-10-30 | Discharge: 2020-10-30 | Disposition: A | Discharge status: Home / Self Care | Payer: BC Managed Care – PPO | Source: Ambulatory Visit | Attending: Obstetrics & Gynecology | Admitting: Obstetrics & Gynecology

## 2020-10-30 ENCOUNTER — Encounter (HOSPITAL_BASED_OUTPATIENT_CLINIC_OR_DEPARTMENT_OTHER): Payer: Self-pay | Admitting: Obstetrics & Gynecology

## 2020-10-30 ENCOUNTER — Encounter (HOSPITAL_BASED_OUTPATIENT_CLINIC_OR_DEPARTMENT_OTHER): Payer: Self-pay

## 2020-10-30 ENCOUNTER — Ambulatory Visit (INDEPENDENT_AMBULATORY_CARE_PROVIDER_SITE_OTHER): Payer: BC Managed Care – PPO | Admitting: Obstetrics & Gynecology

## 2020-10-30 ENCOUNTER — Other Ambulatory Visit: Payer: Self-pay

## 2020-10-30 VITALS — BP 115/78 | HR 71 | Ht 65.25 in | Wt 154.0 lb

## 2020-10-30 DIAGNOSIS — D352 Benign neoplasm of pituitary gland: Secondary | ICD-10-CM

## 2020-10-30 DIAGNOSIS — L900 Lichen sclerosus et atrophicus: Secondary | ICD-10-CM

## 2020-10-30 DIAGNOSIS — R232 Flushing: Secondary | ICD-10-CM

## 2020-10-30 DIAGNOSIS — Z01419 Encounter for gynecological examination (general) (routine) without abnormal findings: Secondary | ICD-10-CM | POA: Diagnosis not present

## 2020-10-30 DIAGNOSIS — Z124 Encounter for screening for malignant neoplasm of cervix: Secondary | ICD-10-CM | POA: Insufficient documentation

## 2020-10-30 DIAGNOSIS — Z86010 Personal history of colonic polyps: Secondary | ICD-10-CM | POA: Diagnosis not present

## 2020-10-30 DIAGNOSIS — Z860101 Personal history of adenomatous and serrated colon polyps: Secondary | ICD-10-CM

## 2020-10-30 MED ORDER — DESVENLAFAXINE SUCCINATE ER 50 MG PO TB24
50.0000 mg | ORAL_TABLET | Freq: Every day | ORAL | 4 refills | Status: DC
Start: 2020-10-30 — End: 2021-12-31

## 2020-10-30 NOTE — Progress Notes (Signed)
60 y.o. G0P0 [name] here for annual exam.  Has a syncopal issues.  Ended up going to cardiologist, Dr. Hulan Saas.  Had coronary CT with score of 0.  Echo was normal.  This was done prior to knee arthroscopy.  Surgery went well.  Dr. French Ana did this.  H/o microprolactinoma.  She thinks she's had a prolactin level this past year.  Denies vaginal bleeding.    Patient's last menstrual period was 03/30/2011.          Sexually active: Yes.    The current method of family planning is post menopausal status.    Exercising: Yes.     Smoker:  no  Health Maintenance: Pap:  2018 History of abnormal Pap:  no MMG:  01/2020 Colonoscopy:  08/2020 BMD:   Done with Dr. Osborne Casco TDaP:  04/22/2017 Hep C testing: 11/2015 Screening Labs: Done with Dr. Osborne Casco   reports that she has never smoked. She has never used smokeless tobacco. She reports current alcohol use. She reports that she does not use drugs.  Past Medical History:  Diagnosis Date  . Allergy   . Alopecia areata   . Arthritis   . Dysmenorrhea   . GERD (gastroesophageal reflux disease)   . Graves' disease 1990  . Hyperlipidemia    with High Small Particle LDL  . IBS (irritable bowel syndrome)   . Leukopenia   . Microprolactinoma ([place])   . Spondylolisthesis of lumbar region   . Syringomyelia ([place]) 2003  . Thyroid disease    hx of, no meds now    Past Surgical History:  Procedure Laterality Date  . BUNIONECTOMY  07/2013   left foot  . COLONOSCOPY  11/2010   2016  . KNEE ARTHROSCOPY W/ MENISCECTOMY Left 03/2018  . MENISCUS REPAIR  07/16/2020  . POLYPECTOMY    . TONSILLECTOMY AND ADENOIDECTOMY  1968    Current Outpatient Medications  Medication Sig Dispense Refill  . Biotin 10 MG CAPS Take by mouth daily.    . cabergoline (DOSTINEX) 0.5 MG tablet 2 (two) times a week.     . desvenlafaxine (PRISTIQ) 50 MG 24 hr tablet TAKE 1 TABLET(50 MG) BY MOUTH DAILY 90 tablet 0  . DEXILANT 60 MG capsule Take 1 capsule  by mouth daily.  4  . simvastatin (ZOCOR) 40 MG tablet 1 tablet Daily.    . Vitamin E 400 units TABS vitamin E    . zinc gluconate 50 MG tablet Take 50 mg by mouth daily.    Marland Kitchen aspirin 81 MG EC tablet Adult Low Dose Aspirin 81 mg tablet,delayed release (Patient not taking: Reported on 10/30/2020)     No current facility-administered medications for this visit.    Family History  Problem Relation Age of Onset  . Hypertension Father   . Heart disease Father   . Heart disease Mother   . Hyperlipidemia Sister   . Hyperlipidemia Sister   . Colon cancer Neg Hx   . Colon polyps Neg Hx   . Esophageal cancer Neg Hx   . Rectal cancer Neg Hx   . Stomach cancer Neg Hx     Review of Systems  All other systems reviewed and are negative.   Exam:   BP 115/78   Pulse 71   Ht 5' 5.25" (1.657 m)   Wt 154 lb (69.9 kg)   LMP 03/30/2011   BMI 25.43 kg/m   Height: 5' 5.25" (165.7 cm)  General appearance: alert, cooperative and appears  stated age Head: Normocephalic, without obvious abnormality, atraumatic Neck: no adenopathy, supple, symmetrical, trachea midline and thyroid normal to inspection and palpation Lungs: clear to auscultation bilaterally Breasts: normal appearance, no masses or tenderness Heart: regular rate and rhythm Abdomen: soft, non-tender; bowel sounds normal; no masses,  no organomegaly Extremities: extremities normal, atraumatic, no cyanosis or edema Skin: Skin color, texture, turgor normal. No rashes or lesions Lymph nodes: Cervical, supraclavicular, and axillary nodes normal. No abnormal inguinal nodes palpated Neurologic: Grossly normal   Pelvic: External genitalia:  no lesions              Urethra:  normal appearing urethra with no masses, tenderness or lesions              Bartholins and Skenes: normal                 Vagina: normal appearing vagina with normal color and no discharge, no lesions              Cervix: no lesions              Pap taken: Yes.    Bimanual Exam:  Uterus:  normal size, contour, position, consistency, mobility, non-tender              Adnexa: normal adnexa and no mass, fullness, tenderness               Rectovaginal: Confirms               Anus:  normal sphincter tone, no lesions  Chaperone, Prince Rome, CMA, was present for exam.  Assessment/Plan: 1. Well woman exam with routine gynecological exam - pap obtained today - colonoscopy up to date - MMG 01/2020 - lab work done with Dr. Osborne Casco - vaccines update  2. Microprolactinoma ([place])   3. Hx of adenomatous polyp of colon  4. Lichen sclerosus  5. Hot flashes - desvenlafaxine (PRISTIQ) 50 MG 24 hr tablet; Take 1 tablet (50 mg total) by mouth daily.  Dispense: 90 tablet; Refill: 4

## 2020-10-31 NOTE — Telephone Encounter (Signed)
Please review the prolactin results. I have updated vaccine information.

## 2020-11-01 LAB — CYTOLOGY - PAP
Comment: NEGATIVE
Diagnosis: NEGATIVE
High risk HPV: NEGATIVE

## 2021-02-17 ENCOUNTER — Encounter (HOSPITAL_BASED_OUTPATIENT_CLINIC_OR_DEPARTMENT_OTHER): Payer: Self-pay | Admitting: Obstetrics & Gynecology

## 2021-07-09 ENCOUNTER — Other Ambulatory Visit: Payer: Self-pay | Admitting: Internal Medicine

## 2021-07-09 ENCOUNTER — Ambulatory Visit (HOSPITAL_BASED_OUTPATIENT_CLINIC_OR_DEPARTMENT_OTHER)
Admission: RE | Admit: 2021-07-09 | Discharge: 2021-07-09 | Disposition: A | Discharge status: Home / Self Care | Payer: BC Managed Care – PPO | Source: Ambulatory Visit | Attending: Internal Medicine | Admitting: Internal Medicine

## 2021-07-09 ENCOUNTER — Other Ambulatory Visit: Payer: Self-pay

## 2021-07-09 ENCOUNTER — Other Ambulatory Visit (HOSPITAL_COMMUNITY): Payer: Self-pay | Admitting: Internal Medicine

## 2021-07-09 ENCOUNTER — Encounter (HOSPITAL_BASED_OUTPATIENT_CLINIC_OR_DEPARTMENT_OTHER): Payer: Self-pay

## 2021-07-09 DIAGNOSIS — R0602 Shortness of breath: Secondary | ICD-10-CM

## 2021-07-09 MED ORDER — IOHEXOL 350 MG/ML SOLN
75.0000 mL | Freq: Once | INTRAVENOUS | Status: AC | PRN
  Administered 2021-07-09: 75 mL via INTRAVENOUS

## 2021-08-08 ENCOUNTER — Encounter: Payer: Self-pay | Admitting: Cardiology

## 2021-08-08 ENCOUNTER — Ambulatory Visit: Payer: BC Managed Care – PPO | Admitting: Cardiology

## 2021-08-08 ENCOUNTER — Other Ambulatory Visit: Payer: Self-pay

## 2021-08-08 VITALS — BP 111/68 | HR 93 | Temp 98.0°F | Ht 65.0 in | Wt 151.0 lb

## 2021-08-08 DIAGNOSIS — Z8249 Family history of ischemic heart disease and other diseases of the circulatory system: Secondary | ICD-10-CM

## 2021-08-08 DIAGNOSIS — R072 Precordial pain: Secondary | ICD-10-CM | POA: Insufficient documentation

## 2021-08-08 DIAGNOSIS — R42 Dizziness and giddiness: Secondary | ICD-10-CM

## 2021-08-08 NOTE — Progress Notes (Signed)
Patient referred by Haywood Pao, MD for presyncope  Subjective:   Darlene Walker, female    DOB: [birth year], 61 y.o.   MRN: [id number]   No chief complaint on file.    HPI  61 y.o. Caucasian female with family history of early CAD, mild hyperlipidemia, now with presyncope.  Patient had severe URI December that required multiple rounds of steroids and antibiotics. On new years day, she had one episode of chest pain at rest while laying in bed that improved after repositioning. She denies any exertional chest pain symptoms.  Initial consultation visit 05/2021: Patient was recently at [facility] for [employer] concert.  She was sitting in a cramped seating, and had pain in her knees owing to her underlying meniscus tear.  1-1/2-hour into the consult, she suddenly felt lightheaded, diaphoretic, had tunnel vision and could not hear the music.  Following this, she had floaters in front of her eyes.  She rested her head down.  Her cousin sitting next to her gave her Sprite to drink.  Patient symptoms resolved in the next couple of minutes.  She did not lose consciousness.  She did not have any recurrence of the symptoms rest of the evening.  She was able to walk to her car without any difficulty.  At baseline, patient does not do any regular exercise owing to her meniscus tear.  However, she stays active with school, church, and other activities.  She has a flight of about 15-20 stairs in her home that she takes multiple times without any complaints of chest pain or shortness of breath.  Patient is a retired Radio producer.  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  Patient is strong family history of coronary artery disease with her mother having had her first MI at 38, father having had first MI in 74s.  She is a lifetime non-smoker.  She is on low-dose simvastatin for mild hyperlipidemia.   Current Outpatient Medications on File Prior to Visit  Medication  Sig Dispense Refill   Biotin 10 MG CAPS Take by mouth daily.     cabergoline (DOSTINEX) 0.5 MG tablet 2 (two) times a week.      desvenlafaxine (PRISTIQ) 50 MG 24 hr tablet Take 1 tablet (50 mg total) by mouth daily. 90 tablet 4   DEXILANT 60 MG capsule Take 1 capsule by mouth daily.  4   simvastatin (ZOCOR) 40 MG tablet 1 tablet Daily.     Vitamin E 400 units TABS vitamin E     zinc gluconate 50 MG tablet Take 50 mg by mouth daily.     No current facility-administered medications on file prior to visit.    Cardiovascular and other pertinent studies:  EKG 08/08/2021: Sinus rhythm 91 bpm  Normal EKG  CTA chest 06/2021: No evidence of pulmonary embolism. Question mild bronchitic changes. Two tiny RIGHT upper lobe pulmonary nodules less than 3 mm diameter.   If patient is low risk for malignancy, no routine follow-up imaging is recommended; if patient is high risk for malignancy, a non-contrast Chest CT at 12 months is optional. If performed and the nodule is stable at 12 months, no further follow-up is recommended.  Echocardiogram 06/17/2020:  Left ventricle cavity is normal in size and  wall thickness. Normal LV  systolic function with EF 56%. Normal global wall motion. Doppler evidence  of grade I (impaired) diastolic dysfunction.  Structurally normal mitral valve.  Mild (Grade I) mitral regurgitation.  No evidence of pulmonary hypertension.  CT cardiac scoring 05/2020: Calcium score 0  Recent labs: 05/28/2020: Glucose 88, BUN/Cr 19/0.8. EGFR 73. Na/K 140/5.1. Rest of the CMP normal H/H 12.9/40. MCV 88. Platelets 193 Chol 171, TG 179, HDL 47, LDL 88 TSH 1.9 normal    Review of Systems  Cardiovascular:  Negative for chest pain, dyspnea on exertion, leg swelling, palpitations and syncope.       Episode of presyncope, as detailed in HPI  Musculoskeletal:  Positive for joint pain.        Vitals:   08/08/21 1058  BP: 111/68  Pulse: 93  Temp: 98 F (36.7 C)  SpO2:  98%    Body mass index is 25.13 kg/m. Filed Weights   08/08/21 1058  Weight: 151 lb (68.5 kg)     Objective:   Physical Exam Vitals and nursing note reviewed.  Constitutional:      General: She is not in acute distress. Neck:     Vascular: No JVD.  Cardiovascular:     Rate and Rhythm: Normal rate and regular rhythm.     Pulses: Normal pulses.     Heart sounds: Normal heart sounds. No murmur heard. Pulmonary:     Effort: Pulmonary effort is normal.     Breath sounds: Normal breath sounds. No wheezing or rales.  Musculoskeletal:     Right lower leg: No edema.     Left lower leg: No edema.        Assessment & Recommendations:   61 y.o. Caucasian female with family history of early CAD, mild hyperlipidemia, now with presyncope.  Presyncope: Episode typical of vasovagal reaction. Structurally normal heart on echocardiogram.  Chest pain: Not angina. Calcium score 0 in 05/2020. No significant abnormalities on CTA chest 06/2021. I do not think she needs any further cardiac workup at this time,.  F/u in 1 year    Nigel Mormon, MD Pager: [phone] Office: [phone]

## 2021-12-08 ENCOUNTER — Encounter: Payer: Self-pay | Admitting: Internal Medicine

## 2021-12-08 ENCOUNTER — Ambulatory Visit: Payer: BC Managed Care – PPO | Admitting: Internal Medicine

## 2021-12-08 VITALS — BP 94/80 | HR 96 | Ht 65.0 in | Wt 151.0 lb

## 2021-12-08 DIAGNOSIS — K219 Gastro-esophageal reflux disease without esophagitis: Secondary | ICD-10-CM | POA: Diagnosis not present

## 2021-12-08 DIAGNOSIS — Z86010 Personal history of colonic polyps: Secondary | ICD-10-CM

## 2021-12-08 NOTE — Patient Instructions (Signed)
If you are age 62 or older, your body mass index should be between 23-30. Your Body mass index is 25.13 kg/m. If this is out of the aforementioned range listed, please consider follow up with your Primary Care Provider.  If you are age 46 or younger, your body mass index should be between 19-25. Your Body mass index is 25.13 kg/m. If this is out of the aformentioned range listed, please consider follow up with your Primary Care Provider.   ________________________________________________________  The [place] [facility] providers would like to encourage you to use [facility] to communicate with providers for non-urgent requests or questions.  Due to long hold times on the telephone, sending your provider a message by [facility] may be a faster and more efficient way to get a response.  Please allow 48 business hours for a response.  Please remember that this is for non-urgent requests.  _______________________________________________________  Darlene Walker have been scheduled for an endoscopy. Please follow written instructions given to you at your visit today. If you use inhalers (even only as needed), please bring them with you on the day of your procedure.

## 2021-12-08 NOTE — Progress Notes (Signed)
HISTORY OF PRESENT ILLNESS:  Darlene Walker is a 61 y.o. female with past medical history as listed below.  She presents today regarding chronic GERD and recent change in her antireflux regimen.  I last saw the patient September 12, 2020 when she underwent surveillance colonoscopy for history of sessile serrated polyps.  She was found to have a diminutive polyp which was removed.  No meaningful tissue available for pathologic analysis.  The remainder the exam was normal.  Follow-up in 7 years recommended.  Patient tells me that she was diagnosed with reflux about 10 years ago.  She had been on a number of PPIs which were ineffective.  However, she was eventually placed on Dexilant milligrams daily.  This has worked [employer].  However, due to insurance formulary preference she was told that they would no longer cover this medication.  She was changed to pantoprazole 40 mg daily.  She found this to be ineffective, particularly at night.  This was increased to 40 mg twice daily as well as adding in Pepcid at night.  This work.  She has subsequently changed to pantoprazole 40 mg in the morning and Pepcid at night.  This works.  No dysphagia.  She has not had endoscopy previously.  Review of outside blood work from January 2023 shows unremarkable CBC with hemoglobin 13.2.  Normal basic metabolic panel.  REVIEW OF SYSTEMS:  All non-GI ROS negative unless otherwise stated in the HPI except for arthritis, visual change, sleeping problems  Past Medical History:  Diagnosis Date   Allergy    Alopecia areata    Arthritis    Dysmenorrhea    GERD (gastroesophageal reflux disease)    Graves' disease 1990   Hyperlipidemia    IBS (irritable bowel syndrome)    Leukopenia    Microprolactinoma ([facility])    Spondylolisthesis of lumbar region    Syringomyelia ([place]) 2003   Thyroid disease    hx of, no meds now    Past Surgical History:  Procedure Laterality Date   BUNIONECTOMY  07/2013   left foot    COLONOSCOPY  11/2010   2016   KNEE ARTHROSCOPY W/ MENISCECTOMY Left 03/2018   MENISCUS REPAIR  07/16/2020   MENISCUS REPAIR Left 03/2020   POLYPECTOMY     TONSILLECTOMY AND ADENOIDECTOMY  1968    Social History Darlene Walker  reports that she has never smoked. She has never used smokeless tobacco. She reports current alcohol use. She reports that she does not use drugs.  family history includes Heart disease in her father and mother; Hyperlipidemia in her sister and sister; Hypertension in her father.  No Known Allergies     PHYSICAL EXAMINATION: Vital signs: BP 94/80   Pulse 96   Ht '5\' 5"'$  (1.651 m)   Wt 151 lb (68.5 kg)   LMP 03/30/2011   BMI 25.13 kg/m   Constitutional: generally well-appearing, no acute distress Psychiatric: alert and oriented x3, cooperative Eyes: extraocular movements intact, anicteric, conjunctiva pink Mouth: oral pharynx moist, no lesions Neck: supple no lymphadenopathy Cardiovascular: heart regular rate and rhythm, no murmur Lungs: clear to auscultation bilaterally Abdomen: soft, nontender, nondistended, no obvious ascites, no peritoneal signs, normal bowel sounds, no organomegaly Rectal: Omitted Extremities: no clubbing, cyanosis, or lower extremity edema bilaterally Skin: no lesions on visible extremities Neuro: No focal deficits.  Cranial nerves intact  ASSESSMENT:  1.  Chronic GERD.  Had been doing well on Dexilant 60 mg daily.  Other standard dose PPI's ineffective.  Currently on pantoprazole 40 mg in the morning and Pepcid 20 mg at night.  Seems to be doing well. 2.  History of sessile serrated polyps.  Last colonoscopy March 2022   PLAN:  1.  Reflux precautions 2.  Continue pantoprazole and Pepcid 3.  Schedule upper endoscopy to evaluate breakthrough reflux symptoms off PPI and rule out Barrett's esophagus.The nature of the procedure, as well as the risks, benefits, and alternatives were carefully and thoroughly reviewed with the  patient. Ample time for discussion and questions allowed. The patient understood, was satisfied, and agreed to proceed. 4.  Surveillance colonoscopy around March 2029 A total time of 30 minutes spent preparing to see the patient, obtaining history, performing physical examination, counseling and educating the patient regarding above listed issues, directing medical therapy, answering questions, ordering endoscopic procedure, and documenting clinical information in the patient's health record

## 2021-12-25 ENCOUNTER — Other Ambulatory Visit (HOSPITAL_BASED_OUTPATIENT_CLINIC_OR_DEPARTMENT_OTHER): Payer: Self-pay | Admitting: Obstetrics & Gynecology

## 2021-12-25 DIAGNOSIS — R232 Flushing: Secondary | ICD-10-CM

## 2021-12-31 ENCOUNTER — Ambulatory Visit (AMBULATORY_SURGERY_CENTER): Payer: BC Managed Care – PPO | Admitting: Internal Medicine

## 2021-12-31 ENCOUNTER — Telehealth (HOSPITAL_BASED_OUTPATIENT_CLINIC_OR_DEPARTMENT_OTHER): Payer: Self-pay | Admitting: Obstetrics & Gynecology

## 2021-12-31 ENCOUNTER — Encounter: Payer: Self-pay | Admitting: Internal Medicine

## 2021-12-31 VITALS — BP 102/62 | HR 67 | Temp 97.7°F | Resp 15 | Ht 65.0 in | Wt 151.0 lb

## 2021-12-31 DIAGNOSIS — K449 Diaphragmatic hernia without obstruction or gangrene: Secondary | ICD-10-CM

## 2021-12-31 DIAGNOSIS — K219 Gastro-esophageal reflux disease without esophagitis: Secondary | ICD-10-CM

## 2021-12-31 DIAGNOSIS — Q453 Other congenital malformations of pancreas and pancreatic duct: Secondary | ICD-10-CM

## 2021-12-31 MED ORDER — SODIUM CHLORIDE 0.9 % IV SOLN: 500.0000 mL | Freq: Once | INTRAVENOUS | Status: DC

## 2021-12-31 NOTE — Telephone Encounter (Signed)
Patient is returning missed call from Maudie Mercury to scheduled an office visit. Please advise?

## 2021-12-31 NOTE — Op Note (Signed)
[facility] Patient Name: Darlene Walker Procedure Date: 12/31/2021 9:39 AM MRN: [id number] Endoscopist: Docia Chuck. Henrene Pastor , MD Age: 61 Referring MD:  Date of Birth: 11/08/1960 Gender: Female Account #: [id number] Procedure:                Upper GI endoscopy Indications:              Esophageal reflux Medicines:                Monitored Anesthesia Care Procedure:                Pre-Anesthesia Assessment:                           - Prior to the procedure, a History and Physical                            was performed, and patient medications and                            allergies were reviewed. The patient's tolerance of                            previous anesthesia was also reviewed. The risks                            and benefits of the procedure and the sedation                            options and risks were discussed with the patient.                            All questions were answered, and informed consent                            was obtained. Prior Anticoagulants: The patient has                            taken no previous anticoagulant or antiplatelet                            agents. ASA Grade Assessment: II - A patient with                            mild systemic disease. After reviewing the risks                            and benefits, the patient was deemed in                            satisfactory condition to undergo the procedure.                           After obtaining informed consent, the endoscope was  passed under direct vision. Throughout the                            procedure, the patient's blood pressure, pulse, and                            oxygen saturations were monitored continuously. The                            Endoscope was introduced through the mouth, and                            advanced to the second part of duodenum. The upper                            GI endoscopy was accomplished without  difficulty.                            The patient tolerated the procedure well. Scope In: Scope Out: Findings:                 The esophagus was normal. No Barrett's.                           The stomach revealed moderate hiatal hernia. In                            addition, in the antrum, was a classic pancreatic                            rest measuring approximately 15 mm. See image.                           The examined duodenum was normal.                           The cardia and gastric fundus were normal on                            retroflexion. Complications:            No immediate complications. Estimated Blood Loss:     Estimated blood loss: none. Impression:               1. Normal esophagus. No Barrett's                           2. Hiatal hernia                           3. Incidental pancreatic rest.                           4. Otherwise normal EGD Recommendation:           - Patient has a contact number available for  emergencies. The signs and symptoms of potential                            delayed complications were discussed with the                            patient. Return to normal activities tomorrow.                            Written discharge instructions were provided to the                            patient.                           - Resume previous diet.                           - Continue present medications.                           - Routine office follow-up with Dr. Henrene Pastor in 1 year Docia Chuck. Henrene Pastor, MD 12/31/2021 9:57:57 AM This report has been signed electronically.

## 2021-12-31 NOTE — Telephone Encounter (Signed)
Patient is returning missed call from [place] about scheduling. Please advise?

## 2021-12-31 NOTE — Progress Notes (Signed)
HISTORY OF PRESENT ILLNESS:   Darlene Walker is a 61 y.o. female with past medical history as listed below.  She presents today regarding chronic GERD and recent change in her antireflux regimen.  I last saw the patient September 12, 2020 when she underwent surveillance colonoscopy for history of sessile serrated polyps.  She was found to have a diminutive polyp which was removed.  No meaningful tissue available for pathologic analysis.  The remainder the exam was normal.  Follow-up in 7 years recommended.  Patient tells me that she was diagnosed with reflux about 10 years ago.  She had been on a number of PPIs which were ineffective.  However, she was eventually placed on Dexilant milligrams daily.  This has worked Retail [employer].  However, due to insurance formulary preference she was told that they would no longer cover this medication.  She was changed to pantoprazole 40 mg daily.  She found this to be ineffective, particularly at night.  This was increased to 40 mg twice daily as well as adding in Pepcid at night.  This work.  She has subsequently changed to pantoprazole 40 mg in the morning and Pepcid at night.  This works.  No dysphagia.  She has not had endoscopy previously.  Review of outside blood work from January 2023 shows unremarkable CBC with hemoglobin 13.2.  Normal basic metabolic panel.   REVIEW OF SYSTEMS:   All non-GI ROS negative unless otherwise stated in the HPI except for arthritis, visual change, sleeping problems       Past Medical History:  Diagnosis Date   Allergy     Alopecia areata     Arthritis     Dysmenorrhea     GERD (gastroesophageal reflux disease)     Graves' disease 1990   Hyperlipidemia     IBS (irritable bowel syndrome)     Leukopenia     Microprolactinoma (HCC)     Spondylolisthesis of lumbar region     Syringomyelia ([place]) 2003   Thyroid disease      hx of, no meds now           Past Surgical History:  Procedure Laterality Date   BUNIONECTOMY    07/2013    left foot   COLONOSCOPY   11/2010    2016   KNEE ARTHROSCOPY W/ MENISCECTOMY Left 03/2018   MENISCUS REPAIR   07/16/2020   MENISCUS REPAIR Left 03/2020   POLYPECTOMY       TONSILLECTOMY AND ADENOIDECTOMY   1968      Social History Darlene Walker  reports that she has never smoked. She has never used smokeless tobacco. She reports current alcohol use. She reports that she does not use drugs.   family history includes Heart disease in her father and mother; Hyperlipidemia in her sister and sister; Hypertension in her father.   No Known Allergies       PHYSICAL EXAMINATION: Vital signs: BP 94/80   Pulse 96   Ht '5\' 5"'$  (1.651 m)   Wt 151 lb (68.5 kg)   LMP 03/30/2011   BMI 25.13 kg/m   Constitutional: generally well-appearing, no acute distress Psychiatric: alert and oriented x3, cooperative Eyes: extraocular movements intact, anicteric, conjunctiva pink Mouth: oral pharynx moist, no lesions Neck: supple no lymphadenopathy Cardiovascular: heart regular rate and rhythm, no murmur Lungs: clear to auscultation bilaterally Abdomen: soft, nontender, nondistended, no obvious ascites, no peritoneal signs, normal bowel sounds, no organomegaly Rectal: Omitted Extremities: no clubbing, cyanosis, or lower  extremity edema bilaterally Skin: no lesions on visible extremities Neuro: No focal deficits.  Cranial nerves intact   ASSESSMENT:   1.  Chronic GERD.  Had been doing well on Dexilant 60 mg daily.  Other standard dose PPI's ineffective.  Currently on pantoprazole 40 mg in the morning and Pepcid 20 mg at night.  Seems to be doing well. 2.  History of sessile serrated polyps.  Last colonoscopy March 2022     PLAN:   1.  Reflux precautions 2.  Continue pantoprazole and Pepcid 3.  Schedule upper endoscopy to evaluate breakthrough reflux symptoms off PPI and rule out Barrett's esophagus.The nature of the procedure, as well as the risks, benefits, and alternatives were  carefully and thoroughly reviewed with the patient. Ample time for discussion and questions allowed. The patient understood, was satisfied, and agreed to proceed. 4.  Surveillance colonoscopy around March 2029

## 2021-12-31 NOTE — Patient Instructions (Signed)
Please read handouts provided. Continue present medications. Resume previous diet. Routine office follow-up with Dr. Henrene Pastor in 1 year.   YOU HAD AN ENDOSCOPIC PROCEDURE TODAY AT [facility]:   Refer to the procedure report that was given to you for any specific questions about what was found during the examination.  If the procedure report does not answer your questions, please call your gastroenterologist to clarify.  If you requested that your care partner not be given the details of your procedure findings, then the procedure report has been included in a sealed envelope for you to review at your convenience later.  YOU SHOULD EXPECT: Some feelings of bloating in the abdomen. Passage of more gas than usual.  Walking can help get rid of the air that was put into your GI tract during the procedure and reduce the bloating. If you had a lower endoscopy (such as a colonoscopy or flexible sigmoidoscopy) you may notice spotting of blood in your stool or on the toilet paper. If you underwent a bowel prep for your procedure, you may not have a normal bowel movement for a few days.  Please Note:  You might notice some irritation and congestion in your nose or some drainage.  This is from the oxygen used during your procedure.  There is no need for concern and it should clear up in a day or so.  SYMPTOMS TO REPORT IMMEDIATELY:  Following upper endoscopy (EGD)  Vomiting of blood or coffee ground material  New chest pain or pain under the shoulder blades  Painful or persistently difficult swallowing  New shortness of breath  Fever of 100F or higher  Black, tarry-looking stools  For urgent or emergent issues, a gastroenterologist can be reached at any hour by calling [phone]. Do not use MyChart messaging for urgent concerns.    DIET:  We do recommend a small meal at first, but then you may proceed to your regular diet.  Drink plenty of fluids but you should avoid alcoholic  beverages for 24 hours.  ACTIVITY:  You should plan to take it easy for the rest of today and you should NOT DRIVE or use heavy machinery until tomorrow (because of the sedation medicines used during the test).    FOLLOW UP: Our staff will call the number listed on your records the next business day following your procedure.  We will call around 7:15- 8:00 am to check on you and address any questions or concerns that you may have regarding the information given to you following your procedure. If we do not reach you, we will leave a message.  If you develop any symptoms (ie: fever, flu-like symptoms, shortness of breath, cough etc.) before then, please call [phone].  If you test positive for Covid 19 in the 2 weeks post procedure, please call and report this information to [place].    If any biopsies were taken you will be contacted by phone or by letter within the next 1-3 weeks.  Please call us at [phone] if you have not heard about the biopsies in 3 weeks.    SIGNATURES/CONFIDENTIALITY: You and/or your care partner have signed paperwork which will be entered into your electronic medical record.  These signatures attest to the fact that that the information above on your After Visit Summary has been reviewed and is understood.  Full responsibility of the confidentiality of this discharge information lies with you and/or your care-partner.

## 2021-12-31 NOTE — Telephone Encounter (Signed)
LMOVM to call office for appt before refill can be sent

## 2021-12-31 NOTE — Progress Notes (Signed)
Report to pacu rn. Vss. Care resumed by rn. 

## 2022-01-01 ENCOUNTER — Telehealth: Payer: Self-pay

## 2022-01-01 NOTE — Telephone Encounter (Signed)
  Follow up Call-     12/31/2021    9:22 AM 09/12/2020    7:43 AM  Call back number  Post procedure Call Back phone  # [phone] cell [phone]  Permission to leave phone message Yes Yes     Patient questions:  Do you have a fever, pain , or abdominal swelling? No. Pain Score  0 *  Have you tolerated food without any problems? Yes.    Have you been able to return to your normal activities? Yes.    Do you have any questions about your discharge instructions: Diet   No. Medications  No. Follow up visit  No.  Do you have questions or concerns about your Care? No.  Actions: * If pain score is 4 or above: No action needed, pain <4. Per Pt she had a "little Cough and Scratchy throat".  Eating without difficulty.  Pt advised to try cough drop.  It will melt and coat and hopefully sooth her throat.  To Call us back if does not resolve.  Pt said she would. maw

## 2022-02-26 ENCOUNTER — Encounter (HOSPITAL_BASED_OUTPATIENT_CLINIC_OR_DEPARTMENT_OTHER): Payer: Self-pay | Admitting: *Deleted

## 2022-03-31 ENCOUNTER — Other Ambulatory Visit (HOSPITAL_BASED_OUTPATIENT_CLINIC_OR_DEPARTMENT_OTHER): Payer: Self-pay | Admitting: Obstetrics & Gynecology

## 2022-03-31 DIAGNOSIS — R232 Flushing: Secondary | ICD-10-CM

## 2022-04-27 ENCOUNTER — Ambulatory Visit (INDEPENDENT_AMBULATORY_CARE_PROVIDER_SITE_OTHER): Payer: BC Managed Care – PPO | Admitting: Obstetrics & Gynecology

## 2022-04-27 ENCOUNTER — Encounter (HOSPITAL_BASED_OUTPATIENT_CLINIC_OR_DEPARTMENT_OTHER): Payer: Self-pay | Admitting: Obstetrics & Gynecology

## 2022-04-27 VITALS — BP 121/52 | HR 83 | Ht 66.0 in | Wt 151.6 lb

## 2022-04-27 DIAGNOSIS — Z86010 Personal history of colonic polyps: Secondary | ICD-10-CM | POA: Diagnosis not present

## 2022-04-27 DIAGNOSIS — D352 Benign neoplasm of pituitary gland: Secondary | ICD-10-CM | POA: Diagnosis not present

## 2022-04-27 DIAGNOSIS — Z860101 Personal history of adenomatous and serrated colon polyps: Secondary | ICD-10-CM

## 2022-04-27 DIAGNOSIS — R232 Flushing: Secondary | ICD-10-CM | POA: Diagnosis not present

## 2022-04-27 DIAGNOSIS — Z01419 Encounter for gynecological examination (general) (routine) without abnormal findings: Secondary | ICD-10-CM | POA: Diagnosis not present

## 2022-04-27 DIAGNOSIS — Z8249 Family history of ischemic heart disease and other diseases of the circulatory system: Secondary | ICD-10-CM

## 2022-04-27 MED ORDER — DESVENLAFAXINE SUCCINATE ER 100 MG PO TB24: 100.0000 mg | ORAL_TABLET | Freq: Every day | ORAL | 0 refills | Status: DC

## 2022-04-27 MED ORDER — DESVENLAFAXINE SUCCINATE ER 50 MG PO TB24: 50.0000 mg | ORAL_TABLET | Freq: Every day | ORAL | 4 refills | Status: DC

## 2022-04-27 NOTE — Progress Notes (Signed)
61 y.o. G0P0 [name] here for annual exam.  Doing well except had laryngitis a few weeks ago.  She had RSV exposure.  Has had a lot of coughing.    Denies vaginal bleeding.  Still having some hot flashes.  Asks about HRT today.  I do not feel this is a good idea.  Discussed increasing the prestiq to '100mg'$ .  She is going to try this.  H/o prolactinoma.  Dr. Osborne Walker checks levels.    Patient's last menstrual period was 03/30/2011.          Sexually active: Yes.    The current method of family planning is post menopausal status.    Exercising: Yes.     Smoker:  no  Health Maintenance: Pap:  2022 neg with neg HR HPV History of abnormal Pap:  no MMG:  01/2022 Colonoscopy:  08/2020, follow up 7 years BMD:   done with Dr. Osborne Walker Screening Labs: does with Dr. Osborne Walker.  Last done 05/2022.   reports that she has never smoked. She has never used smokeless tobacco. She reports current alcohol use. She reports that she does not use drugs.  Past Medical History:  Diagnosis Date   Allergy    Alopecia areata    Arthritis    Dysmenorrhea    GERD (gastroesophageal reflux disease)    Graves' disease 1990   Hyperlipidemia    IBS (irritable bowel syndrome)    Leukopenia    Microprolactinoma (HCC)    Pituitary tumor    Spondylolisthesis of lumbar region    Syringomyelia ([place]) 2003   Thyroid disease    hx of, no meds now    Past Surgical History:  Procedure Laterality Date   BUNIONECTOMY  07/2013   left foot   COLONOSCOPY  11/2010   2016   KNEE ARTHROSCOPY W/ MENISCECTOMY Left 03/2018   MENISCUS REPAIR  07/16/2020   MENISCUS REPAIR Left 03/2020   POLYPECTOMY     TONSILLECTOMY AND ADENOIDECTOMY  1968    Current Outpatient Medications  Medication Sig Dispense Refill   Biotin 10 MG CAPS Take by mouth daily.     cabergoline (DOSTINEX) 0.5 MG tablet 2 (two) times a week.      desvenlafaxine (PRISTIQ) 50 MG 24 hr tablet TAKE ONE TABLET BY MOUTH ONE TIME DAILY 90  tablet 0   diclofenac (VOLTAREN) 75 MG EC tablet Take 75 mg by mouth as needed.     pantoprazole (PROTONIX) 40 MG tablet Take 40 mg by mouth 2 (two) times daily.     simvastatin (ZOCOR) 40 MG tablet 1 tablet Daily.     Vitamin D, Cholecalciferol, 10 MCG (400 UNIT) TABS Take by mouth.     zinc gluconate 50 MG tablet Take 50 mg by mouth daily.     No current facility-administered medications for this visit.    Family History  Problem Relation Age of Onset   Hypertension Father    Heart disease Father    Heart disease Mother    Hyperlipidemia Sister    Hyperlipidemia Sister    Colon cancer Neg Hx    Colon polyps Neg Hx    Esophageal cancer Neg Hx    Rectal cancer Neg Hx    Stomach cancer Neg Hx     ROS: Constitutional: negative Genitourinary:negative  Exam:   BP (!) 121/52 (BP Location: Right Arm, Patient Position: Sitting, Cuff Size: Normal)   Pulse 83   Ht '5\' 6"'$  (1.676 m) Comment: Reported  Wt 151 lb 9.6 oz (68.8 kg)   LMP 03/30/2011   BMI 24.47 kg/m   Height: '5\' 6"'$  (167.6 cm) (Reported)  General appearance: alert, cooperative and appears stated age Head: Normocephalic, without obvious abnormality, atraumatic Neck: no adenopathy, supple, symmetrical, trachea midline and thyroid normal to inspection and palpation Lungs: clear to auscultation bilaterally Breasts: normal appearance, no masses or tenderness Heart: regular rate and rhythm Abdomen: soft, non-tender; bowel sounds normal; no masses,  no organomegaly Extremities: extremities normal, atraumatic, no cyanosis or edema Skin: Skin color, texture, turgor normal. No rashes or lesions Lymph nodes: Cervical, supraclavicular, and axillary nodes normal. No abnormal inguinal nodes palpated Neurologic: Grossly normal   Pelvic: External genitalia:  no lesions              Urethra:  normal appearing urethra with no masses, tenderness or lesions              Bartholins and Skenes: normal                 Vagina: normal  appearing vagina with normal color and no discharge, no lesions              Cervix: no lesions              Pap taken: No. Bimanual Exam:  Uterus:  normal size, contour, position, consistency, mobility, non-tender              Adnexa: normal adnexa and no mass, fullness, tenderness               Rectovaginal: Confirms               Anus:  normal sphincter tone, no lesions  Chaperone, Darlene Walker, CMA, was present for exam.  Assessment/Plan: 1. Well woman exam with routine gynecological exam - Pap smear neg 2022 - Mammogram up to date - Colonoscopy 2022 - Bone mineral density done with Dr. Osborne Walker - lab work done 05/2022 - vaccines reviewed/updated  2. Microprolactinoma ([place]) - on medication.  Considering weaning off this year.  3. Hx of adenomatous polyp of colon  4. Hot flashes - will try '100mg'$  dosing for the next month or two.  Rx for #90 sent to [place].    5. Family history of early CAD - coronary CT has been done and had score of 0

## 2022-08-10 ENCOUNTER — Ambulatory Visit: Payer: BC Managed Care – PPO | Admitting: Cardiology

## 2022-08-20 ENCOUNTER — Encounter: Payer: Self-pay | Admitting: Cardiology

## 2022-08-20 ENCOUNTER — Ambulatory Visit: Payer: BC Managed Care – PPO | Admitting: Cardiology

## 2022-08-20 ENCOUNTER — Encounter (HOSPITAL_BASED_OUTPATIENT_CLINIC_OR_DEPARTMENT_OTHER): Payer: Self-pay | Admitting: Obstetrics & Gynecology

## 2022-08-20 VITALS — BP 105/61 | HR 79 | Resp 16 | Ht 66.0 in | Wt 149.0 lb

## 2022-08-20 DIAGNOSIS — E782 Mixed hyperlipidemia: Secondary | ICD-10-CM

## 2022-08-20 DIAGNOSIS — R42 Dizziness and giddiness: Secondary | ICD-10-CM

## 2022-08-20 NOTE — Progress Notes (Signed)
Patient referred by Haywood Pao, MD for presyncope  Subjective:   Darlene Walker, female    DOB: 04-Apr-1961, 62 y.o.   MRN: [id number]   Chief Complaint  Patient presents with   Postural dizziness with presyncope   Follow-up    6 month      HPI  62 y.o. Caucasian female with family history of early CAD, mild hyperlipidemia, now with presyncope.  Patient is doing well, denies chest pain, shortness of breath, palpitations, leg edema, orthopnea, PND, TIA/syncope.  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  Reviewed recent test results with the patient, details below.    Initial consultation visit 05/2021: Patient was recently at [facility] for [employer] concert.  She was sitting in a cramped seating, and had pain in her knees owing to her underlying meniscus tear.  1-1/2-hour into the consult, she suddenly felt lightheaded, diaphoretic, had tunnel vision and could not hear the music.  Following this, she had floaters in front of her eyes.  She rested her head down.  Her cousin sitting next to her gave her Sprite to drink.  Patient symptoms resolved in the next couple of minutes.  She did not lose consciousness.  She did not have any recurrence of the symptoms rest of the evening.  She was able to walk to her car without any difficulty.  At baseline, patient does not do any regular exercise owing to her meniscus tear.  However, she stays active with school, church, and other activities.  She has a flight of about 15-20 stairs in her home that she takes multiple times without any complaints of chest pain or shortness of breath.  Patient is a retired Radio producer.  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  Patient is strong family history of coronary artery disease with her mother having had her first MI at 55, father having had first MI in 7s.  She is a lifetime non-smoker.  She  is on low-dose simvastatin for mild hyperlipidemia.    Current Outpatient Medications:    Biotin 10 MG CAPS, Take by mouth daily., Disp: , Rfl:    desvenlafaxine (PRISTIQ) 50 MG 24 hr tablet, Take 1 tablet (50 mg total) by mouth daily., Disp: 90 tablet, Rfl: 4   diclofenac (VOLTAREN) 75 MG EC tablet, Take 75 mg by mouth as needed., Disp: , Rfl:    gabapentin (NEURONTIN) 100 MG capsule, Take 100 mg by mouth daily as needed., Disp: , Rfl:    pantoprazole (PROTONIX) 40 MG tablet, Take 40 mg by mouth 2 (two) times daily., Disp: , Rfl:    simvastatin (ZOCOR) 40 MG tablet, 1 tablet Daily., Disp: , Rfl:    Vitamin D, Cholecalciferol, 10 MCG (400 UNIT) TABS, Take by mouth., Disp: , Rfl:   Cardiovascular and other pertinent studies:  EKG 08/20/2022: Sinus rhythm 79 bpm Normal EKG  CTA chest 06/2021: No evidence of pulmonary embolism. Question mild bronchitic changes. Two tiny RIGHT upper lobe pulmonary nodules less than 3 mm diameter.   If patient is low risk for malignancy, no routine follow-up imaging is recommended; if patient is high risk for malignancy, a non-contrast Chest CT at 12 months is optional. If performed and the nodule is stable at 12 months, no further follow-up is recommended.  Echocardiogram 06/17/2020:  Left ventricle cavity is normal in size and  wall thickness. Normal LV  systolic function  with EF 56%. Normal global wall motion. Doppler evidence  of grade I (impaired) diastolic dysfunction.  Structurally normal mitral valve.  Mild (Grade I) mitral regurgitation.  No evidence of pulmonary hypertension.  CT cardiac scoring 05/2020: Calcium score 0  Recent labs: 07/08/2022: Glucose NA, BUN/Cr 14/0.9. EGFR 63. K 4.5. Hb 12.8 HbA1C NA Chol 205, TG 162, HDL 54, LDL 119 TSH 2.0 normal  05/28/2020: Glucose 88, BUN/Cr 19/0.8. EGFR 73. Na/K 140/5.1. Rest of the CMP normal H/H 12.9/40. MCV 88. Platelets 193 Chol 171, TG 179, HDL 47, LDL 88 TSH 1.9  normal    Review of Systems  Cardiovascular:  Negative for chest pain, dyspnea on exertion, leg swelling, palpitations and syncope.       Episode of presyncope, as detailed in HPI  Musculoskeletal:  Positive for joint pain.  Neurological:  Positive for light-headedness.         Vitals:   08/20/22 1414 08/20/22 1420  BP: 105/61   Pulse: 79   Resp: 16   SpO2: 98% 92%   Orthostatic VS for the past 72 hrs (Last 3 readings):  Orthostatic BP Patient Position BP Location Cuff Size Orthostatic Pulse  08/20/22 1422 107/67 Standing Left Arm Normal 86  08/20/22 1421 110/59 Sitting Left Arm Normal 80  08/20/22 1420 111/62 Supine Left Arm Normal 82     Body mass index is 24.05 kg/m. Filed Weights   08/20/22 1414  Weight: 149 lb (67.6 kg)     Objective:   Physical Exam Vitals and nursing note reviewed.  Constitutional:      General: She is not in acute distress. Neck:     Vascular: No JVD.  Cardiovascular:     Rate and Rhythm: Normal rate and regular rhythm.     Pulses: Normal pulses.     Heart sounds: Normal heart sounds. No murmur heard. Pulmonary:     Effort: Pulmonary effort is normal.     Breath sounds: Normal breath sounds. No wheezing or rales.  Musculoskeletal:     Right lower leg: No edema.     Left lower leg: No edema.         Assessment & Recommendations:   62 y.o. Caucasian female with family history of early CAD, mild hyperlipidemia, now with presyncope.  Presyncope: Episode typical of vasovagal reaction. Structurally normal heart on echocardiogram. No recurrence.  Chest pain: No recurrence. Calcium score 0 in 05/2020. No significant abnormalities on CTA chest 06/2021. I do not think she needs any further cardiac workup at this time,.  Mixed hyperlipidemia: Discussed diet modifications. Repeat lipid panel in 6 months.  F/u in 1 year    Nigel Mormon, MD Pager: [phone] Office: [phone]

## 2022-08-21 ENCOUNTER — Encounter: Payer: Self-pay | Admitting: Cardiology

## 2022-08-21 ENCOUNTER — Other Ambulatory Visit (HOSPITAL_BASED_OUTPATIENT_CLINIC_OR_DEPARTMENT_OTHER): Payer: Self-pay | Admitting: Obstetrics & Gynecology

## 2022-08-21 DIAGNOSIS — R232 Flushing: Secondary | ICD-10-CM

## 2022-08-21 MED ORDER — DESVENLAFAXINE SUCCINATE ER 50 MG PO TB24: 50.0000 mg | ORAL_TABLET | Freq: Every day | ORAL | 4 refills | Status: DC

## 2022-08-31 ENCOUNTER — Encounter: Payer: Self-pay | Admitting: Internal Medicine

## 2022-12-05 ENCOUNTER — Other Ambulatory Visit (HOSPITAL_BASED_OUTPATIENT_CLINIC_OR_DEPARTMENT_OTHER): Payer: Self-pay | Admitting: Obstetrics & Gynecology

## 2022-12-05 DIAGNOSIS — R232 Flushing: Secondary | ICD-10-CM

## 2023-01-05 ENCOUNTER — Encounter: Payer: Self-pay | Admitting: Internal Medicine

## 2023-01-05 ENCOUNTER — Ambulatory Visit (INDEPENDENT_AMBULATORY_CARE_PROVIDER_SITE_OTHER): Payer: BC Managed Care – PPO | Admitting: Internal Medicine

## 2023-01-05 VITALS — BP 102/64 | HR 79 | Ht 66.0 in | Wt 149.5 lb

## 2023-01-05 DIAGNOSIS — K219 Gastro-esophageal reflux disease without esophagitis: Secondary | ICD-10-CM

## 2023-01-05 DIAGNOSIS — Z86010 Personal history of colonic polyps: Secondary | ICD-10-CM

## 2023-01-05 MED ORDER — PANTOPRAZOLE SODIUM 40 MG PO TBEC: 40.0000 mg | DELAYED_RELEASE_TABLET | Freq: Two times a day (BID) | ORAL | 3 refills | Status: AC

## 2023-01-05 NOTE — Patient Instructions (Signed)
We have sent the following medications to your pharmacy for you to pick up at your convenience:  Pantoprazole.  Please follow up in one year.  _______________________________________________________  If your blood pressure at your visit was 140/90 or greater, please contact your primary care physician to follow up on this.  _______________________________________________________  If you are age 63 or older, your body mass index should be between 23-30. Your Body mass index is 24.13 kg/m. If this is out of the aforementioned range listed, please consider follow up with your Primary Care Provider.  If you are age 66 or younger, your body mass index should be between 19-25. Your Body mass index is 24.13 kg/m. If this is out of the aformentioned range listed, please consider follow up with your Primary Care Provider.   ________________________________________________________  The [facility] providers would like to encourage you to use [facility] to communicate with providers for non-urgent requests or questions.  Due to long hold times on the telephone, sending your provider a message by [facility] may be a faster and more efficient way to get a response.  Please allow 48 business hours for a response.  Please remember that this is for non-urgent requests.  _______________________________________________________

## 2023-01-05 NOTE — Progress Notes (Signed)
HISTORY OF PRESENT ILLNESS:  Darlene Walker is a 62 y.o. female who presents today regarding management of chronic GERD and the need for medication refill.  The patient was last seen in this office 08/10/2021 regarding chronic GERD and a history of sessile serrated polyps.  He had dictation for details.  She was having breakthrough symptoms on her current medical regimen.  Thus, upper endoscopy was performed December 31, 2021.  She was found to have a moderate hiatal hernia and incidental pancreatic rest stone.  The exam was otherwise normal.  No Barrett's or active esophagitis.  Her pantoprazole prescription was adjusted to 40 mg twice daily.  She was asked to follow-up in 1 year.  Patient's please report that pantoprazole 40 mg twice daily controlled symptoms.  She rarely has breakthrough.  She will notice significant symptoms if she misses a dose of medication.  No dysphagia or other complaints.  Tolerating the medication well.  Last colonoscopy 2022.    REVIEW OF SYSTEMS:  All non-GI ROS negative except for arthritis, skin rash  Past Medical History:  Diagnosis Date   Allergy    Alopecia areata    Arthritis    Dysmenorrhea    GERD (gastroesophageal reflux disease)    Graves' disease 1990   Hyperlipidemia    IBS (irritable bowel syndrome)    Leukopenia    Microprolactinoma (HCC)    Pituitary tumor    Spondylolisthesis of lumbar region    Syringomyelia (HCC) 2003   Thyroid disease    hx of, no meds now    Past Surgical History:  Procedure Laterality Date   BUNIONECTOMY  07/2013   left foot   COLONOSCOPY  11/2010   2016   KNEE ARTHROSCOPY W/ MENISCECTOMY Left 03/2018   MENISCUS REPAIR  07/16/2020   MENISCUS REPAIR Left 03/2020   POLYPECTOMY     TONSILLECTOMY AND ADENOIDECTOMY  1968    Social History Darlene Walker  reports that she has never smoked. She has never used smokeless tobacco. She reports current alcohol use. She reports that she does not use drugs.  family  history includes Heart disease in her father and mother; Hyperlipidemia in her sister and sister; Hypertension in her father.  No Known Allergies     PHYSICAL EXAMINATION: Vital signs: BP 102/64 (BP Location: Left Arm, Patient Position: Sitting, Cuff Size: Normal)   Pulse 79   Ht 5\' 6"  (1.676 m)   Wt 149 lb 8 oz (67.8 kg)   LMP 03/30/2011   SpO2 98%   BMI 24.13 kg/m   Constitutional: generally well-appearing, no acute distress Psychiatric: alert and oriented x3, cooperative Eyes: extraocular movements intact, anicteric, conjunctiva pink Mouth: oral pharynx moist, no lesions Neck: supple no lymphadenopathy Cardiovascular: heart regular rate and rhythm, no murmur Lungs: clear to auscultation bilaterally Abdomen: soft, nontender, nondistended, no obvious ascites, no peritoneal signs, normal bowel sounds, no organomegaly Rectal: Omitted Extremities: no clubbing, cyanosis, or lower extremity edema bilaterally Skin: no lesions on visible extremities Neuro: No focal deficits.  Cranial nerves intact  ASSESSMENT:  1.  GERD.  Symptoms require twice daily PPI for control.  EGD 2023 as noted 2.  History of sessile serrated polyps.  Last examination 2022   PLAN:  1.  Reflux precautions 2.  Refill pantoprazole 40 mg twice daily.  Medication risks reviewed. 3.  Routine office follow-up 1 year. 4.  Surveillance colonoscopy around March 2029

## 2023-02-28 ENCOUNTER — Other Ambulatory Visit (HOSPITAL_BASED_OUTPATIENT_CLINIC_OR_DEPARTMENT_OTHER): Payer: Self-pay | Admitting: Obstetrics & Gynecology

## 2023-02-28 DIAGNOSIS — R232 Flushing: Secondary | ICD-10-CM

## 2023-03-02 MED ORDER — DESVENLAFAXINE SUCCINATE ER 50 MG PO TB24: 50.0000 mg | ORAL_TABLET | Freq: Every day | ORAL | 0 refills | Status: DC

## 2023-03-12 LAB — LAB REPORT - SCANNED: EGFR: 72.4

## 2023-03-31 ENCOUNTER — Encounter (HOSPITAL_BASED_OUTPATIENT_CLINIC_OR_DEPARTMENT_OTHER): Payer: Self-pay | Admitting: *Deleted

## 2023-04-12 ENCOUNTER — Encounter: Payer: Self-pay | Admitting: Gastroenterology

## 2023-04-12 ENCOUNTER — Ambulatory Visit: Payer: BC Managed Care – PPO | Admitting: Gastroenterology

## 2023-04-12 VITALS — BP 110/80 | HR 88 | Ht 66.0 in | Wt 151.0 lb

## 2023-04-12 DIAGNOSIS — R197 Diarrhea, unspecified: Secondary | ICD-10-CM

## 2023-04-12 DIAGNOSIS — Z860100 Personal history of colon polyps, unspecified: Secondary | ICD-10-CM | POA: Diagnosis not present

## 2023-04-12 DIAGNOSIS — Z09 Encounter for follow-up examination after completed treatment for conditions other than malignant neoplasm: Secondary | ICD-10-CM

## 2023-04-12 DIAGNOSIS — R1084 Generalized abdominal pain: Secondary | ICD-10-CM

## 2023-04-12 NOTE — Patient Instructions (Addendum)
   _______________________________________________________  If your blood pressure at your visit was 140/90 or greater, please contact your primary care physician to follow up on this.  _______________________________________________________  If you are age 63 or older, your body mass index should be between 23-30. Your Body mass index is 24.37 kg/m. If this is out of the aforementioned range listed, please consider follow up with your Primary Care Provider.  If you are age 103 or younger, your body mass index should be between 19-25. Your Body mass index is 24.37 kg/m. If this is out of the aformentioned range listed, please consider follow up with your Primary Care Provider.   ________________________________________________________  The [facility] providers would like to encourage you to use [facility] to communicate with providers for non-urgent requests or questions.  Due to long hold times on the telephone, sending your provider a message by [facility] may be a faster and more efficient way to get a response.  Please allow 48 business hours for a response.  Please remember that this is for non-urgent requests.  _______________________________________________________ It was a pleasure to see you today!  Thank you for trusting me with your gastrointestinal care!

## 2023-04-12 NOTE — Progress Notes (Signed)
Agree with assessment and plans. Most likely an acute self-limited viral gastroenteritis associated with diarrhea. Expectant management is in order

## 2023-04-12 NOTE — Progress Notes (Addendum)
Chief Complaint: Abdominal pain and diarrhea Primary GI MD: Dr. Marina Goodell  HPI: 62 year old female with medical history as listed below presents for evaluation of abdominal pain and diarrhea  Last seen 12/2022 by Dr. Marina Goodell.  At that time GERD was controlled on twice daily PPI.  History of sessile serrated polyps with last colonoscopy in 2022.  She is due for repeat March 2029.  History of EGD 2023 which showed moderate hiatal hernia and incidental pancreatic rest.  Otherwise normal.  No Barrett's or active esophagitis.  Patient states she is here to discuss abdominal pain and diarrhea that was occurring in August/September that has since resolved.  She decided to keep this appointment to discuss if there are any neck steps.  Patient states August 18 she began having profuse diarrhea and lower abdominal cramping.  This lasted for 2 days.  She was having around 10 diarrheal episodes per day including nocturnal diarrhea.  She said it felt like a colonoscopy cleanout.  She then states she was okay for 4 days and then it came back and lasted for a few days then should be okay for a few days then it would come back intermittently.  She did have 1 instance in which she noticed significant mucus with a small blood clot.  She took Pepto-Bismol and Imodium throughout the 3-week duration of her intermittent diarrhea.  This resolved by the beginning of September and she has not had an issue since.  Denied sick contacts.  Denies antibiotics.  States during this time she saw her PCP since she was unable to get in with GI.  PCP did blood work which she reports was normal (not in our system).  No stool studies were done.  She is doing well at this time.   PREVIOUS GI WORKUP   EGD 12/31/2021 for chronic GERD 1. Normal esophagus. No Barrett' s  2. Hiatal hernia  3. Incidental pancreatic rest.  4. Otherwise normal EGD  Colonoscopy 09/12/2020 for history of colon polyps (sessile serrated less than 10 mm in size),  previous exams 2012 and 2016 - One 1 mm polyp in the rectum, removed with a cold snare. Resected and retrieved.  - The examination was otherwise normal on direct and retroflexion views. - repeat 7 years (2029)  Past Medical History:  Diagnosis Date   Allergy    Alopecia areata    Arthritis    Dysmenorrhea    GERD (gastroesophageal reflux disease)    Graves' disease 1990   Hyperlipidemia    IBS (irritable bowel syndrome)    Leukopenia    Microprolactinoma (HCC)    Pituitary tumor    Spondylolisthesis of lumbar region    Syringomyelia (HCC) 2003   Thyroid disease    hx of, no meds now    Past Surgical History:  Procedure Laterality Date   BUNIONECTOMY  07/2013   left foot   COLONOSCOPY  11/2010   2016   KNEE ARTHROSCOPY W/ MENISCECTOMY Left 03/2018   MENISCUS REPAIR  07/16/2020   MENISCUS REPAIR Left 03/2020   POLYPECTOMY     TONSILLECTOMY AND ADENOIDECTOMY  1968    Current Outpatient Medications  Medication Sig Dispense Refill   Biotin 10 MG CAPS Take by mouth daily.     desvenlafaxine (PRISTIQ) 50 MG 24 hr tablet Take 1 tablet (50 mg total) by mouth daily. 90 tablet 0   diclofenac (VOLTAREN) 75 MG EC tablet Take 75 mg by mouth as needed.     gabapentin (NEURONTIN) 100  MG capsule Take 100 mg by mouth daily as needed.     pantoprazole (PROTONIX) 40 MG tablet Take 1 tablet (40 mg total) by mouth 2 (two) times daily. 180 tablet 3   simvastatin (ZOCOR) 40 MG tablet 1 tablet Daily.     Vitamin D, Cholecalciferol, 10 MCG (400 UNIT) TABS Take by mouth.     No current facility-administered medications for this visit.    Allergies as of 04/12/2023   (No Known Allergies)    Family History  Problem Relation Age of Onset   Hypertension Father    Heart disease Father    Heart disease Mother    Hyperlipidemia Sister    Hyperlipidemia Sister    Colon cancer Neg Hx    Colon polyps Neg Hx    Esophageal cancer Neg Hx    Rectal cancer Neg Hx    Stomach cancer Neg Hx      Social History   Socioeconomic History   Marital status: Single    Spouse name: Not on file   Number of children: 0   Years of education: Not on file   Highest education level: Not on file  Occupational History    Employer: [employer]  Tobacco Use   Smoking status: Never   Smokeless tobacco: Never  Vaping Use   Vaping status: Never Used  Substance and Sexual Activity   Alcohol use: Yes    Alcohol/week: 0.0 - 1.0 standard drinks of alcohol    Comment: occasional   Drug use: No   Sexual activity: Not Currently    Partners: Male    Birth control/protection: Post-menopausal  Other Topics Concern   Not on file  Social History Narrative   Not on file   Social Determinants of Health   Financial Resource Strain: Not on file  Food Insecurity: Not on file  Transportation Needs: Not on file  Physical Activity: Not on file  Stress: Not on file  Social Connections: Unknown (11/11/2021)   Received from [facility], [facility]   Social Network    Social Network: Not on file  Intimate Partner Violence: Unknown (10/03/2021)   Received from [employer], [facility]   HITS    Physically Hurt: Not on file    Insult or Talk Down To: Not on file    Threaten Physical Harm: Not on file    Scream or Curse: Not on file    Review of Systems:    Constitutional: No weight loss, fever, chills, weakness or fatigue HEENT: Eyes: No change in vision               Ears, Nose, Throat:  No change in hearing or congestion Skin: No rash or itching Cardiovascular: No chest pain, chest pressure or palpitations   Respiratory: No SOB or cough Gastrointestinal: See HPI and otherwise negative Genitourinary: No dysuria or change in urinary frequency Neurological: No headache, dizziness or syncope Musculoskeletal: No new muscle or joint pain Hematologic: No bleeding or bruising Psychiatric: No history of depression or anxiety    Physical Exam:  Vital signs: LMP 03/30/2011    Constitutional: NAD, Well developed, Well nourished, alert and cooperative Head:  Normocephalic and atraumatic. Eyes:   PEERL, EOMI. No icterus. Conjunctiva pink. Respiratory: Respirations even and unlabored. Lungs clear to auscultation bilaterally.   No wheezes, crackles, or rhonchi.  Cardiovascular:  Regular rate and rhythm. No peripheral edema, cyanosis or pallor.  Gastrointestinal:  Soft, nondistended, nontender. No rebound or guarding. Normal bowel sounds.  No appreciable masses or hepatomegaly. Rectal:  Not performed.  Msk:  Symmetrical without gross deformities. Without edema, no deformity or joint abnormality.  Neurologic:  Alert and  oriented x4;  grossly normal neurologically.  Skin:   Dry and intact without significant lesions or rashes. Psychiatric: Oriented to person, place and time. Demonstrates good judgement and reason without abnormal affect or behaviors.   RELEVANT LABS AND IMAGING: CBC    Component Value Date/Time   WBC 7.1 03/14/2018 1338   RBC 4.70 03/14/2018 1338   HGB 13.3 03/14/2018 1338   HCT 41.3 03/14/2018 1338   PLT 280 03/14/2018 1338   MCV 88 03/14/2018 1338   MCH 28.3 03/14/2018 1338   MCHC 32.2 03/14/2018 1338   RDW 13.4 03/14/2018 1338    CMP     Component Value Date/Time   NA 140 03/14/2018 1338   K 4.4 03/14/2018 1338   CL 98 03/14/2018 1338   CO2 26 03/14/2018 1338   GLUCOSE 81 03/14/2018 1338   BUN 16 03/14/2018 1338   CREATININE 0.94 03/14/2018 1338   CALCIUM 9.5 03/14/2018 1338   PROT 7.0 03/14/2018 1338   ALBUMIN 4.6 03/14/2018 1338   AST 20 03/14/2018 1338   ALT 21 03/14/2018 1338   ALKPHOS 92 03/14/2018 1338   BILITOT 0.3 03/14/2018 1338   GFRNONAA 67 03/14/2018 1338   GFRAA 77 03/14/2018 1338     Assessment/Plan:   Generalized abdominal pain Acute diarrhea Intermittent diarrhea and abdominal pain over the course of 3 weeks with 10 watery bowel movements per day and 1 episode of mucus/small blood clot.  This has since  resolved.  No sick contacts.  No antibiotic use.  Normal labs.  No stool studies were completed.  Suspect patient may have had an episode of infectious diarrhea.  She is up-to-date on her colonoscopy which is reassuring.  - Advised if symptoms return to please contact us and we can do [employer] stool studies - Reassurance  History of colonic polyps Due for repeat March 2029  Lara Mulch [facility] 04/12/2023, 10:02 AM  Cc: Gaspar Garbe, MD

## 2023-05-10 ENCOUNTER — Ambulatory Visit (HOSPITAL_BASED_OUTPATIENT_CLINIC_OR_DEPARTMENT_OTHER): Payer: BC Managed Care – PPO | Admitting: Obstetrics & Gynecology

## 2023-05-10 ENCOUNTER — Encounter (HOSPITAL_BASED_OUTPATIENT_CLINIC_OR_DEPARTMENT_OTHER): Payer: Self-pay | Admitting: Obstetrics & Gynecology

## 2023-05-10 VITALS — BP 120/78 | HR 83 | Ht 66.5 in | Wt 150.8 lb

## 2023-05-10 DIAGNOSIS — Z860101 Personal history of adenomatous and serrated colon polyps: Secondary | ICD-10-CM

## 2023-05-10 DIAGNOSIS — Z01419 Encounter for gynecological examination (general) (routine) without abnormal findings: Secondary | ICD-10-CM

## 2023-05-10 DIAGNOSIS — R232 Flushing: Secondary | ICD-10-CM | POA: Diagnosis not present

## 2023-05-10 DIAGNOSIS — Z803 Family history of malignant neoplasm of breast: Secondary | ICD-10-CM

## 2023-05-10 DIAGNOSIS — D352 Benign neoplasm of pituitary gland: Secondary | ICD-10-CM

## 2023-05-10 MED ORDER — VEOZAH 45 MG PO TABS: 1.0000 | ORAL_TABLET | Freq: Every day | ORAL | 0 refills | Status: DC

## 2023-05-10 MED ORDER — DESVENLAFAXINE SUCCINATE ER 50 MG PO TB24: 50.0000 mg | ORAL_TABLET | Freq: Every day | ORAL | 3 refills | Status: DC

## 2023-05-10 NOTE — Progress Notes (Deleted)
62 y.o. G0P0 [name] here for annual exam.  Still having hot flashes.  Has used gabapentin.  This didn't really help.  She is on prestiq as well and this has helped some but she still has them.  She has not been on HRT and doesn't want to be on this.  Veozah discussed.  Had liver enzymes checked during this time.  Older sister, Thurston Hole, diagnosed with DCIS.  She is 70 and had a lumpectomy and is on Tamoxifen.  Genetic testing not done.  Pt asked about local study looking at genetic abnormalities.  I think she is going to proceed.    Denies vaginal bleeding.    H/o prolactinoma.  Dr. Wylene Simmer checks this.    Patient's last menstrual period was 03/30/2011.          Exercising: no Smoker:  no  Health Maintenance: Pap:  10/30/2020 History of abnormal Pap:  no MMG:  03/02/2023 Colonoscopy:  08/2020, follow up 7 years  BMD:   Done with Dr. Wylene Simmer Screening Labs: Has appt scheduled in early January   reports that she has never smoked. She has never used smokeless tobacco. She reports current alcohol use. She reports that she does not use drugs.  Past Medical History:  Diagnosis Date   Allergy    Alopecia areata    Arthritis    Dysmenorrhea    GERD (gastroesophageal reflux disease)    Graves' disease 1990   Hyperlipidemia    IBS (irritable bowel syndrome)    Leukopenia    Microprolactinoma (HCC)    Pituitary tumor    Spondylolisthesis of lumbar region    Syringomyelia (HCC) 2003   Thyroid disease    hx of, no meds now    Past Surgical History:  Procedure Laterality Date   BUNIONECTOMY  07/2013   left foot   COLONOSCOPY  11/2010   2016   KNEE ARTHROSCOPY W/ MENISCECTOMY Left 03/2018   MENISCUS REPAIR  07/16/2020   MENISCUS REPAIR Left 03/2020   POLYPECTOMY     TONSILLECTOMY AND ADENOIDECTOMY  1968    Current Outpatient Medications  Medication Sig Dispense Refill   Biotin 10 MG CAPS Take by mouth daily.     desvenlafaxine (PRISTIQ) 50 MG 24 hr tablet  Take 1 tablet (50 mg total) by mouth daily. 90 tablet 0   diclofenac (VOLTAREN) 75 MG EC tablet Take 75 mg by mouth as needed.     pantoprazole (PROTONIX) 40 MG tablet Take 1 tablet (40 mg total) by mouth 2 (two) times daily. 180 tablet 3   simvastatin (ZOCOR) 40 MG tablet 1 tablet Daily.     Vitamin D, Cholecalciferol, 10 MCG (400 UNIT) TABS Take by mouth.     No current facility-administered medications for this visit.    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, on Tamoxifen, no genetic tesitng   Hyperlipidemia Sister    Hyperlipidemia Sister    Colon cancer Neg Hx    Colon polyps Neg Hx    Esophageal cancer Neg Hx    Rectal cancer Neg Hx    Stomach cancer Neg Hx     ROS: Constitutional: negative Genitourinary:negative  Exam:   BP 120/78 (BP Location: Right Arm, Patient Position: Sitting, Cuff Size: Normal)  Pulse 83   Ht 5' 6.5" (1.689 m)   Wt 150 lb 12.8 oz (68.4 kg)   LMP 03/30/2011   BMI 23.98 kg/m   Height: 5' 6.5" (168.9 cm)  General appearance: alert, cooperative and appears stated age Head: Normocephalic, without obvious abnormality, atraumatic Neck: no adenopathy, supple, symmetrical, trachea midline and thyroid normal to inspection and palpation Lungs: clear to auscultation bilaterally Breasts: normal appearance, no masses or tenderness Heart: regular rate and rhythm Abdomen: soft, non-tender; bowel sounds normal; no masses,  no organomegaly Extremities: extremities normal, atraumatic, no cyanosis or edema Skin: Skin color, texture, turgor normal. No rashes or lesions Lymph nodes: Cervical, supraclavicular, and axillary nodes normal. No abnormal inguinal nodes palpated Neurologic: Grossly normal   Pelvic: External genitalia:  no lesions              Urethra:  normal appearing urethra with no masses, tenderness  or lesions              Bartholins and Skenes: normal                 Vagina: normal appearing vagina with normal color and no discharge, no lesions              Cervix: no lesions              Pap taken: No. Bimanual Exam:  Uterus:  normal size, contour, position, consistency, mobility, non-tender              Adnexa: normal adnexa and no mass, fullness, tenderness               Rectovaginal: Confirms               Anus:  normal sphincter tone, no lesions  Chaperone, Ina Homes, CMA, was present for exam.  Assessment/Plan: 1. Well woman exam with routine gynecological exam - Pap smear neg with neg HR HPV 2022.  Guidelines reviewed. - Mammogram 03/02/2023 - Colonoscopy 09/12/2020 - Bone mineral density discussed - lab work done with PCP, Dr. Wylene Simmer - vaccines reviewed/updated  2. Hot flashes - discussed trial of Veozah with pt.  She did have liver enzymes done in September.  She is aware this needs to be checked ever 3 months.   - RF for Prestiq 50mg  daily done today  3. Hx of adenomatous polyp of colon  4. Microprolactinoma (HCC) - prolactin levels followed by Dr. Wylene Simmer  5. Family history of breast cancer - in her sister this past year

## 2023-05-10 NOTE — Progress Notes (Signed)
62 y.o. G0P0 [name] here for annual exam.  Still having hot flashes.  Has used gabapentin.  This didn't really help.  She is on prestiq as well and this has helped some but she still has them.  She has not been on HRT and doesn't want to be on this.  Veozah discussed.  Had liver enzymes checked during this time.  Older sister, Thurston Hole, diagnosed with DCIS.  She is 39 and had a lumpectomy and is on Tamoxifen.  Genetic testing not done.  Pt asked about local study looking at genetic abnormalities.  I think she is going to proceed.    Denies vaginal bleeding.    H/o prolactinoma.  Dr. Wylene Simmer checks this.    Patient's last menstrual period was 03/30/2011.          Exercising: no Smoker:  no  Health Maintenance: Pap:  10/30/2020 History of abnormal Pap:  no MMG:  03/02/2023 Colonoscopy:  08/2020, follow up 7 years  BMD:   Done with Dr. Wylene Simmer Screening Labs: Has appt scheduled in early January   reports that she has never smoked. She has never used smokeless tobacco. She reports current alcohol use. She reports that she does not use drugs.  Past Medical History:  Diagnosis Date   Allergy    Alopecia areata    Arthritis    Dysmenorrhea    GERD (gastroesophageal reflux disease)    Graves' disease 1990   Hyperlipidemia    IBS (irritable bowel syndrome)    Leukopenia    Microprolactinoma (HCC)    Pituitary tumor    Spondylolisthesis of lumbar region    Syringomyelia (HCC) 2003   Thyroid disease    hx of, no meds now    Past Surgical History:  Procedure Laterality Date   BUNIONECTOMY  07/2013   left foot   COLONOSCOPY  11/2010   2016   KNEE ARTHROSCOPY W/ MENISCECTOMY Left 03/2018   MENISCUS REPAIR  07/16/2020   MENISCUS REPAIR Left 03/2020   POLYPECTOMY     TONSILLECTOMY AND ADENOIDECTOMY  1968    Current Outpatient Medications  Medication Sig Dispense Refill   Biotin 10 MG CAPS Take by mouth daily.     diclofenac (VOLTAREN) 75 MG EC tablet Take 75  mg by mouth as needed.     Fezolinetant (VEOZAH) 45 MG TABS Take 1 tablet (45 mg total) by mouth daily. 90 tablet 0   pantoprazole (PROTONIX) 40 MG tablet Take 1 tablet (40 mg total) by mouth 2 (two) times daily. 180 tablet 3   simvastatin (ZOCOR) 40 MG tablet 1 tablet Daily.     Vitamin D, Cholecalciferol, 10 MCG (400 UNIT) TABS Take by mouth.     desvenlafaxine (PRISTIQ) 50 MG 24 hr tablet Take 1 tablet (50 mg total) by mouth daily. 90 tablet 3   No current facility-administered medications for this visit.    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, on Tamoxifen, no genetic tesitng   Hyperlipidemia Sister    Hyperlipidemia Sister    Colon cancer Neg Hx    Colon polyps Neg Hx    Esophageal cancer Neg Hx    Rectal cancer Neg Hx    Stomach cancer Neg Hx     ROS: Constitutional: negative  Genitourinary:negative  Exam:   BP 120/78 (BP Location: Right Arm, Patient Position: Sitting, Cuff Size: Normal)   Pulse 83   Ht 5' 6.5" (1.689 m)   Wt 150 lb 12.8 oz (68.4 kg)   LMP 03/30/2011   BMI 23.98 kg/m   Height: 5' 6.5" (168.9 cm)  General appearance: alert, cooperative and appears stated age Head: Normocephalic, without obvious abnormality, atraumatic Neck: no adenopathy, supple, symmetrical, trachea midline and thyroid normal to inspection and palpation Lungs: clear to auscultation bilaterally Breasts: normal appearance, no masses or tenderness Heart: regular rate and rhythm Abdomen: soft, non-tender; bowel sounds normal; no masses,  no organomegaly Extremities: extremities normal, atraumatic, no cyanosis or edema Skin: Skin color, texture, turgor normal. No rashes or lesions Lymph nodes: Cervical, supraclavicular, and axillary nodes normal. No abnormal inguinal nodes palpated Neurologic: Grossly normal   Pelvic: External  genitalia:  no lesions              Urethra:  normal appearing urethra with no masses, tenderness or lesions              Bartholins and Skenes: normal                 Vagina: normal appearing vagina with normal color and no discharge, no lesions              Cervix: no lesions              Pap taken: No. Bimanual Exam:  Uterus:  normal size, contour, position, consistency, mobility, non-tender              Adnexa: normal adnexa and no mass, fullness, tenderness               Rectovaginal: Confirms               Anus:  normal sphincter tone, no lesions  Chaperone, Ina Homes, CMA, was present for exam.  Assessment/Plan: 1. Well woman exam with routine gynecological exam - Pap smear neg with neg HR HPV 2022.  Guidelines reviewed. - Mammogram 03/02/2023 - Colonoscopy 09/12/2020 - Bone mineral density discussed - lab work done with PCP, Dr. Wylene Simmer - vaccines reviewed/updated  2. Hot flashes - discussed trial of Veozah with pt.  She did have liver enzymes done in September.  She is aware this needs to be checked ever 3 months.   - RF for Prestiq 50mg  daily done today  3. Hx of adenomatous polyp of colon  4. Microprolactinoma (HCC) - prolactin levels followed by Dr. Wylene Simmer  5. Family history of breast cancer - in her sister this past year

## 2023-05-10 NOTE — Progress Notes (Deleted)
62 y.o. G0P0 [name] here for annual exam.    Patient's last menstrual period was 03/30/2011.          Sexually active: {yes no:314532}  The current method of family planning is {contraception:315051}.     The pregnancy intention screening data noted above was reviewed. Potential methods of contraception were discussed. The patient elected to proceed with No data recorded.  Exercising: {yes no:314532}  {types:19826} Smoker:  {YES [id number]  Health Maintenance: Pap:  10/30/2020 Negative History of abnormal Pap:  {YES NO:22349} MMG:  03/02/2023 Negative Colonoscopy:  09/12/2020 BMD:   *** Screening Labs: ***   reports that she has never smoked. She has never used smokeless tobacco. She reports current alcohol use. She reports that she does not use drugs.  Past Medical History:  Diagnosis Date   Allergy    Alopecia areata    Arthritis    Dysmenorrhea    GERD (gastroesophageal reflux disease)    Graves' disease 1990   Hyperlipidemia    IBS (irritable bowel syndrome)    Leukopenia    Microprolactinoma (HCC)    Pituitary tumor    Spondylolisthesis of lumbar region    Syringomyelia (HCC) 2003   Thyroid disease    hx of, no meds now    Past Surgical History:  Procedure Laterality Date   BUNIONECTOMY  07/2013   left foot   COLONOSCOPY  11/2010   2016   KNEE ARTHROSCOPY W/ MENISCECTOMY Left 03/2018   MENISCUS REPAIR  07/16/2020   MENISCUS REPAIR Left 03/2020   POLYPECTOMY     TONSILLECTOMY AND ADENOIDECTOMY  1968    Current Outpatient Medications  Medication Sig Dispense Refill   Biotin 10 MG CAPS Take by mouth daily.     desvenlafaxine (PRISTIQ) 50 MG 24 hr tablet Take 1 tablet (50 mg total) by mouth daily. 90 tablet 0   diclofenac (VOLTAREN) 75 MG EC tablet Take 75 mg by mouth as needed.     pantoprazole (PROTONIX) 40 MG tablet Take 1 tablet (40 mg total) by mouth 2 (two) times daily. 180 tablet 3   simvastatin (ZOCOR) 40 MG tablet 1 tablet  Daily.     Vitamin D, Cholecalciferol, 10 MCG (400 UNIT) TABS Take by mouth.     No current facility-administered medications for this visit.    Family History  Problem Relation Age of Onset   Hypertension Father    Heart disease Father    Heart disease Mother    Hyperlipidemia Sister    Hyperlipidemia Sister    Colon cancer Neg Hx    Colon polyps Neg Hx    Esophageal cancer Neg Hx    Rectal cancer Neg Hx    Stomach cancer Neg Hx     ROS: Constitutional: {Findings; ROS constitutional:30497::"negative"} Genitourinary:{Findings; ROS genitourinary:19593::"negative"}  Exam:   BP 120/78 (BP Location: Right Arm, Patient Position: Sitting, Cuff Size: Normal)   Pulse 83   Ht 5' 6.5" (1.689 m)   Wt 150 lb 12.8 oz (68.4 kg)   LMP 03/30/2011   BMI 23.98 kg/m   Height: 5' 6.5" (168.9 cm)  General appearance: alert, cooperative and appears stated age Head: Normocephalic, without obvious abnormality, atraumatic Neck: no adenopathy, supple, symmetrical, trachea midline and thyroid {EXAM; THYROID:18604} Lungs: clear to auscultation bilaterally Breasts: {Exam; breast:13139::"normal appearance, no masses or tenderness"} Heart: regular rate and rhythm Abdomen: soft, non-tender; bowel sounds normal; no masses,  no organomegaly Extremities: extremities normal, atraumatic, no cyanosis or edema Skin:  Skin color, texture, turgor normal. No rashes or lesions Lymph nodes: Cervical, supraclavicular, and axillary nodes normal. No abnormal inguinal nodes palpated Neurologic: Grossly normal   Pelvic: External genitalia:  no lesions              Urethra:  normal appearing urethra with no masses, tenderness or lesions              Bartholins and Skenes: normal                 Vagina: normal appearing vagina with normal color and no discharge, no lesions              Cervix: {exam; cervix:14595}              Pap taken: {yes no:314532} Bimanual Exam:  Uterus:  {exam; uterus:12215}               Adnexa: {exam; adnexa:12223}               Rectovaginal: Confirms               Anus:  normal sphincter tone, no lesions  Chaperone, ***, CMA, was present for exam.  Assessment/Plan:

## 2023-05-17 ENCOUNTER — Encounter (HOSPITAL_BASED_OUTPATIENT_CLINIC_OR_DEPARTMENT_OTHER): Payer: Self-pay | Admitting: Obstetrics & Gynecology

## 2023-07-05 ENCOUNTER — Encounter (HOSPITAL_BASED_OUTPATIENT_CLINIC_OR_DEPARTMENT_OTHER): Payer: Self-pay | Admitting: Obstetrics & Gynecology

## 2023-07-20 ENCOUNTER — Other Ambulatory Visit (HOSPITAL_BASED_OUTPATIENT_CLINIC_OR_DEPARTMENT_OTHER): Payer: Self-pay | Admitting: Obstetrics & Gynecology

## 2023-07-20 DIAGNOSIS — R232 Flushing: Secondary | ICD-10-CM

## 2023-07-20 MED ORDER — DESVENLAFAXINE SUCCINATE ER 25 MG PO TB24: 25.0000 mg | ORAL_TABLET | Freq: Every day | ORAL | 0 refills | Status: DC

## 2023-08-16 ENCOUNTER — Other Ambulatory Visit (HOSPITAL_BASED_OUTPATIENT_CLINIC_OR_DEPARTMENT_OTHER): Payer: Self-pay | Admitting: *Deleted

## 2023-08-16 DIAGNOSIS — R232 Flushing: Secondary | ICD-10-CM

## 2023-08-16 MED ORDER — DESVENLAFAXINE SUCCINATE ER 25 MG PO TB24: 25.0000 mg | ORAL_TABLET | Freq: Every day | ORAL | 8 refills | Status: DC

## 2023-08-18 ENCOUNTER — Other Ambulatory Visit: Payer: Self-pay | Admitting: Medical Genetics

## 2023-08-20 ENCOUNTER — Ambulatory Visit: Payer: Self-pay | Admitting: Cardiology

## 2023-08-21 ENCOUNTER — Other Ambulatory Visit (HOSPITAL_BASED_OUTPATIENT_CLINIC_OR_DEPARTMENT_OTHER): Payer: Self-pay | Admitting: Obstetrics & Gynecology

## 2023-08-23 ENCOUNTER — Other Ambulatory Visit (HOSPITAL_BASED_OUTPATIENT_CLINIC_OR_DEPARTMENT_OTHER): Payer: Self-pay | Admitting: *Deleted

## 2023-08-24 ENCOUNTER — Ambulatory Visit: Payer: 59 | Attending: Cardiology | Admitting: Cardiology

## 2023-08-24 ENCOUNTER — Encounter: Payer: Self-pay | Admitting: Cardiology

## 2023-08-24 VITALS — BP 124/70 | HR 90 | Ht 66.0 in | Wt 150.0 lb

## 2023-08-24 DIAGNOSIS — R0602 Shortness of breath: Secondary | ICD-10-CM | POA: Diagnosis not present

## 2023-08-24 DIAGNOSIS — E782 Mixed hyperlipidemia: Secondary | ICD-10-CM

## 2023-08-24 NOTE — Progress Notes (Signed)
 Cardiology Office Note:  .   Date:  08/24/2023  ID:  Darlene Walker, DOB 1961-03-27, MRN [id number] PCP: Gaspar Garbe, MD  [facility] Providers Cardiologist:  Truett Mainland, MD PCP: Gaspar Garbe, MD  Chief Complaint  Patient presents with   Shortness of Breath      History of Present Illness: .    Darlene Walker is a 63 y.o. female with family history of early CAD, mild hyperlipidemia, h/o presyncope.  Patient has been doing well, without any complaints of exertional chest pain with regular walking.  Yesterday, she was walking her dog.  She recollects wearing heavy coat, although it was not particularly cold yesterday.  She has been told by her bulldog was chasing other dogs.  While trying to control her dog, she felt lightheaded tunnel vision.  She had to stop the walking, lay down for bed, after which she felt better.  She had somewhat similar event 3 years ago while being at a concert in [facility].  Today, she feels that she is having to take a deep breath, but denies any other particular symptoms.  Vitals:   08/24/23 1418  BP: 124/70  Pulse: 90  SpO2: 98%     ROS:  Review of Systems  Cardiovascular:  Negative for chest pain, dyspnea on exertion, leg swelling, palpitations and syncope.  Respiratory:  Positive for shortness of breath.      Studies Reviewed: Marland Kitchen        EKG 08/24/2023: Normal sinus rhythm Normal ECG No previous ECGs available    Independently interpreted 06/2023: Chol 181, TG 98, HDL 46, LDL 98 Hb 12.8 Cr 0.9 TSH 2.4  Echocardiogram 06/17/2020:  Left ventricle cavity is normal in size and  wall thickness. Normal LV  systolic function with EF 56%. Normal global wall motion. Doppler evidence  of grade I (impaired) diastolic dysfunction.  Structurally normal mitral valve.  Mild (Grade I) mitral regurgitation.  No evidence of pulmonary hypertension.   CT cardiac scoring 2021: Calcium score 0    Physical  Exam:   Physical Exam Vitals and nursing note reviewed.  Constitutional:      General: She is not in acute distress. Neck:     Vascular: No JVD.  Cardiovascular:     Rate and Rhythm: Normal rate and regular rhythm.     Heart sounds: Normal heart sounds. No murmur heard. Pulmonary:     Effort: Pulmonary effort is normal.     Breath sounds: Normal breath sounds. No wheezing or rales.  Musculoskeletal:     Right lower leg: No edema.     Left lower leg: No edema.      VISIT DIAGNOSES:   ICD-10-CM   1. Precordial pain  R07.2 EKG 12-Lead    2. Mixed hyperlipidemia  E78.2 EKG 12-Lead       ASSESSMENT AND PLAN: .    Darlene Walker is a 63 y.o. female with family history of early CAD, mild hyperlipidemia, h/o presyncope.   Presyncope: Episode of feeling lightheaded while walking outside with a heavy coat on, and to control her dog who was chasing other dogs.  Symptoms similar to prior episode of presyncope while at a concert.  I do recommend getting overheated is her common trigger for presyncope episodes.  Avoid such triggers, increase hydration.    Shortness of breath today without any abnormalities on physical exam, vital signs, EKG.  We can monitor symptoms next couple days.  If any recurrence,  will consider echocardiogram, exercise treadmill stress test.    Mixed hyperlipidemia: LDL 98 (06/2023), calcium score 0 (2021). Recommend heart healthy diet and lifestyle.        F/u in 1 year  Signed, Elder Negus, MD

## 2023-08-24 NOTE — Patient Instructions (Signed)
 Medication Instructions:   Your physician recommends that you continue on your current medications as directed. Please refer to the Current Medication list given to you today.  *If you need a refill on your cardiac medications before your next appointment, please call your pharmacy*   Follow-Up: At [facility], you and your health needs are our priority.  As part of our continuing mission to provide you with exceptional heart care, we have created designated Provider Care Teams.  These Care Teams include your primary Cardiologist (physician) and Advanced Practice Providers (APPs -  Physician Assistants and Nurse Practitioners) who all work together to provide you with the care you need, when you need it.  We recommend signing up for the patient portal called "MyChart".  Sign up information is provided on this After Visit Summary.  MyChart is used to connect with patients for Virtual Visits (Telemedicine).  Patients are able to view lab/test results, encounter notes, upcoming appointments, etc.  Non-urgent messages can be sent to your provider as well.   To learn more about what you can do with MyChart, go to [email or web address].    Your next appointment:   1 year(s)  Provider:   DR. Rosemary Holms

## 2023-08-25 ENCOUNTER — Other Ambulatory Visit (HOSPITAL_COMMUNITY)
Admission: RE | Admit: 2023-08-25 | Discharge: 2023-08-25 | Disposition: A | Discharge status: Home / Self Care | Payer: Self-pay | Source: Ambulatory Visit | Attending: Medical Genetics | Admitting: Medical Genetics

## 2023-09-08 LAB — GENECONNECT MOLECULAR SCREEN: Genetic Analysis Overall Interpretation: NEGATIVE

## 2023-10-05 IMAGING — CT CT ANGIO CHEST
2 of 7 series · 13 of 36 positions shown · IV contrast (agent unspecified)
Comparison: None.

CLINICAL DATA: Dizziness and shortness of breath for 1 month

EXAM:
CT ANGIOGRAPHY CHEST WITH CONTRAST
TECHNIQUE: Multidetector CT imaging of the chest was performed using the
standard protocol during bolus administration of intravenous
contrast. Multiplanar CT image reconstructions and MIPs were
obtained to evaluate the vascular anatomy.

[Series 6: pe axial thins · axial · 0.70mm/px · z∈[-281,-39]mm · 12 of 287 slices shown]
[im 23/287  lung]
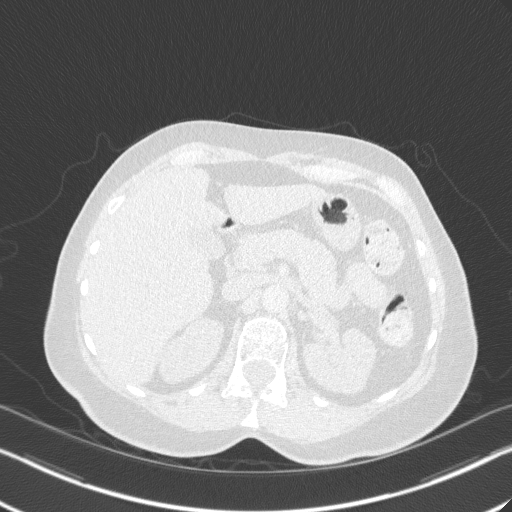
[im 45/287  mediastinal]
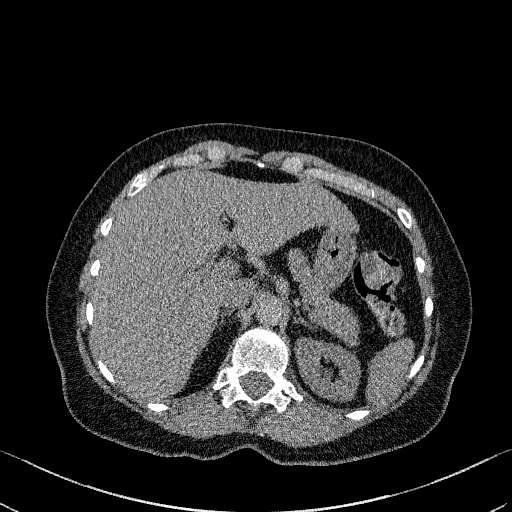
[im 67/287  lung]
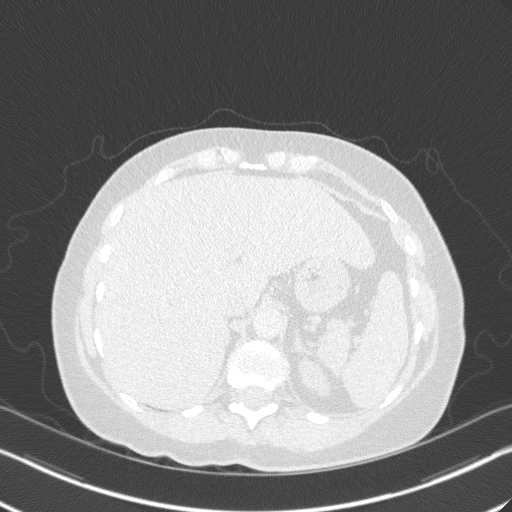
[im 89/287  mediastinal]
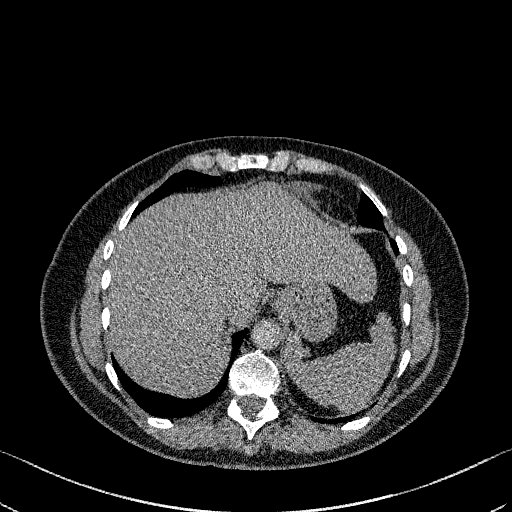
[im 111/287  lung]
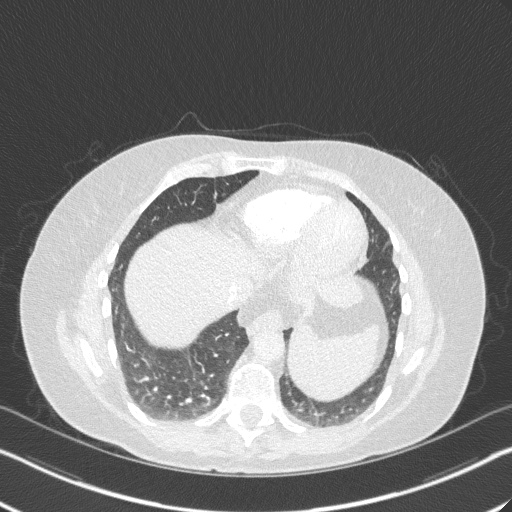
[im 133/287  mediastinal]
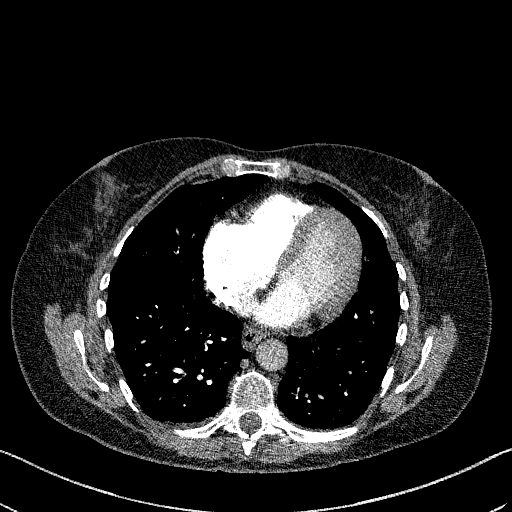
[im 155/287  lung]
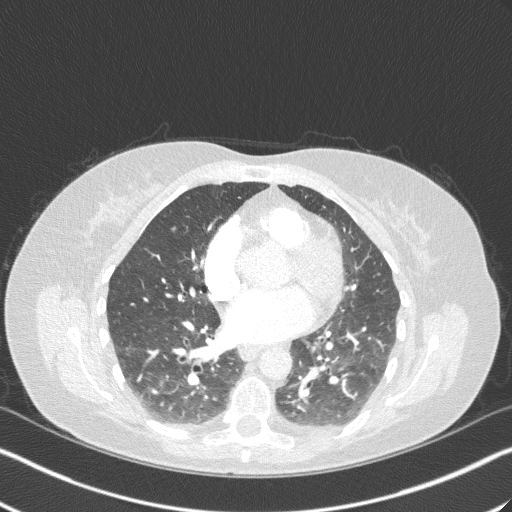
[im 177/287  mediastinal]
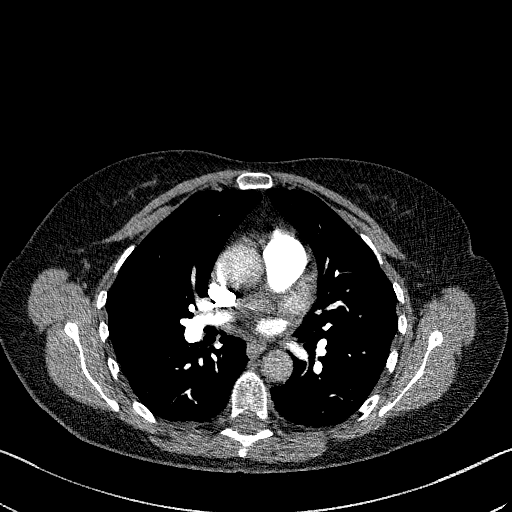
[im 199/287  lung]
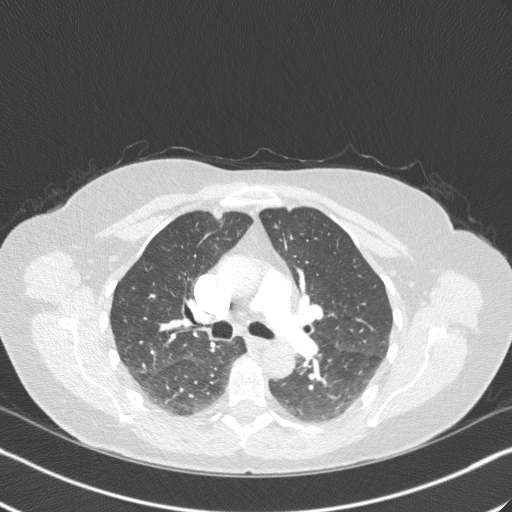
[im 221/287  mediastinal]
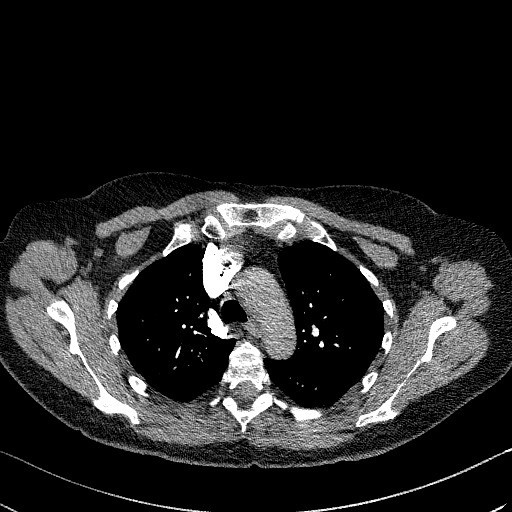
[im 243/287  lung]
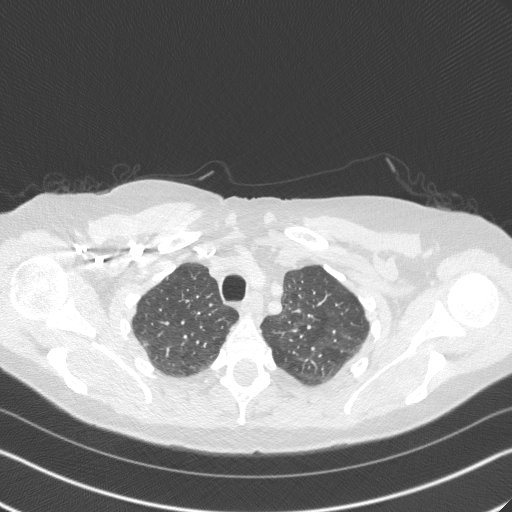
[im 265/287  mediastinal]
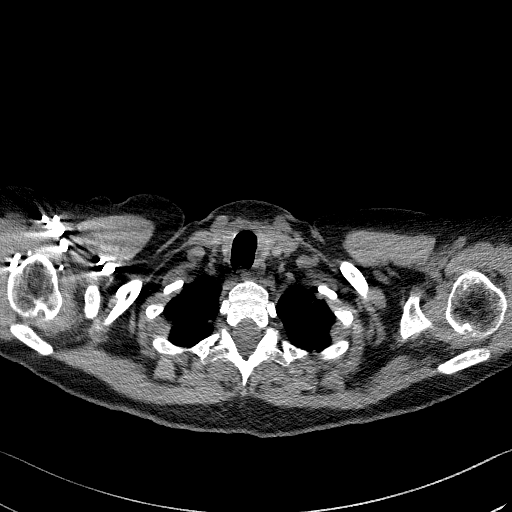

[Series 8: cor soft · coronal · 0.56mm/px · 1 of 129 slices shown]
[im 65/129  mediastinal]
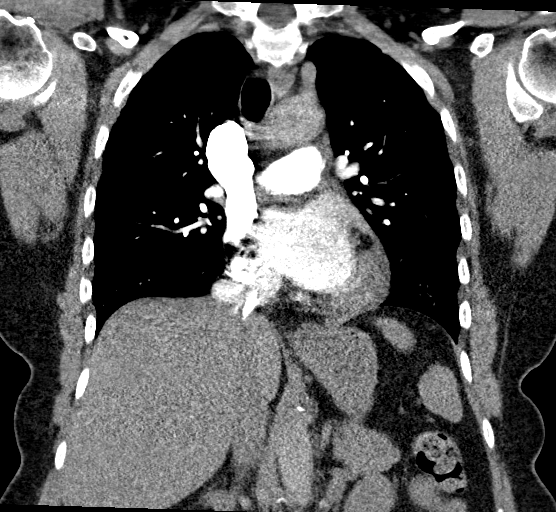

[13 of 36 positions shown; findings below may reference images not displayed]

RADIATION DOSE REDUCTION: This exam was performed according to the
departmental dose-optimization program which includes automated
exposure control, adjustment of the mA and/or kV according to
patient size and/or use of iterative reconstruction technique.

CONTRAST:  75mL OMNIPAQUE IOHEXOL 350 MG/ML SOLN
FINDINGS: Cardiovascular: Aorta normal caliber without aneurysm or dissection.
Heart unremarkable. No pericardial effusion. Pulmonary arteries
adequately opacified and patent. No evidence of pulmonary embolism.

Mediastinum/Nodes: Esophagus unremarkable. No adenopathy. Base of
cervical region normal appearance.

Lungs/Pleura: Peribronchial thickening. Lungs clear. No pulmonary
infiltrate, pleural effusion, or pneumothorax. Two tiny questionable
nodules identified in RIGHT upper lobe images 63 and 67

Upper Abdomen: Small hepatic cyst. Remaining upper abdomen
unremarkable.

Musculoskeletal: No acute osseous abnormalities.

Review of the MIP images confirms the above findings.
IMPRESSION: No evidence of pulmonary embolism.

Question mild bronchitic changes.

Two tiny RIGHT upper lobe pulmonary nodules less than 3 mm diameter.

If patient is low risk for malignancy, no routine follow-up imaging
is recommended; if patient is high risk for malignancy, a
non-contrast Chest CT at 12 months is optional. If performed and the
nodule is stable at 12 months, no further follow-up is recommended.

These guidelines do not apply to immunocompromised patients and
patients with cancer. Follow up in patients with significant
comorbidities as clinically warranted. For lung cancer screening,
adhere to Lung-RADS guidelines. Reference: Radiology. 0405;

## 2023-11-12 ENCOUNTER — Other Ambulatory Visit (HOSPITAL_COMMUNITY): Payer: Self-pay | Admitting: Internal Medicine

## 2023-11-12 ENCOUNTER — Other Ambulatory Visit (HOSPITAL_BASED_OUTPATIENT_CLINIC_OR_DEPARTMENT_OTHER)

## 2023-11-12 DIAGNOSIS — R232 Flushing: Secondary | ICD-10-CM

## 2023-11-13 LAB — HEPATIC FUNCTION PANEL
ALT: 21 IU/L (ref 0–32)
AST: 20 IU/L (ref 0–40)
Albumin: 4.5 g/dL (ref 3.9–4.9)
Alkaline Phosphatase: 77 IU/L (ref 44–121)
Bilirubin Total: 0.4 mg/dL (ref 0.0–1.2)
Bilirubin, Direct: 0.11 mg/dL (ref 0.00–0.40)
Total Protein: 6.8 g/dL (ref 6.0–8.5)

## 2023-11-16 ENCOUNTER — Encounter (HOSPITAL_BASED_OUTPATIENT_CLINIC_OR_DEPARTMENT_OTHER): Payer: Self-pay | Admitting: Obstetrics & Gynecology

## 2023-11-17 ENCOUNTER — Ambulatory Visit (HOSPITAL_BASED_OUTPATIENT_CLINIC_OR_DEPARTMENT_OTHER): Payer: Self-pay | Admitting: Obstetrics & Gynecology

## 2023-11-17 DIAGNOSIS — R232 Flushing: Secondary | ICD-10-CM

## 2023-11-17 DIAGNOSIS — Z79899 Other long term (current) drug therapy: Secondary | ICD-10-CM

## 2023-11-17 MED ORDER — VEOZAH 45 MG PO TABS: 1.0000 | ORAL_TABLET | Freq: Every day | ORAL | 0 refills | Status: DC

## 2023-11-29 ENCOUNTER — Other Ambulatory Visit (HOSPITAL_BASED_OUTPATIENT_CLINIC_OR_DEPARTMENT_OTHER): Payer: Self-pay | Admitting: Obstetrics & Gynecology

## 2023-11-29 DIAGNOSIS — M810 Age-related osteoporosis without current pathological fracture: Secondary | ICD-10-CM

## 2023-12-15 ENCOUNTER — Telehealth (HOSPITAL_BASED_OUTPATIENT_CLINIC_OR_DEPARTMENT_OTHER): Payer: Self-pay | Admitting: Obstetrics & Gynecology

## 2023-12-15 DIAGNOSIS — N644 Mastodynia: Secondary | ICD-10-CM

## 2023-12-15 NOTE — Telephone Encounter (Signed)
 Called pt.  Questions about possible treatment for osteoporosis discussed.  She has appt for consult with osteoporosis clinic.  She is going to check with her insurance about coverage for specific medications before the appointment.  She has been having right breast pain for about a month.  No breast trauma.  No nipple discharge.  She isn't due until her September for screening.  Discussed going ahead with diagnostic imaging at this point.  Orders placed.  These will be faxed to [facility].  She will call about this tomorrow.

## 2023-12-15 NOTE — Telephone Encounter (Signed)
 Opened in error

## 2023-12-16 ENCOUNTER — Encounter (HOSPITAL_BASED_OUTPATIENT_CLINIC_OR_DEPARTMENT_OTHER): Payer: Self-pay | Admitting: *Deleted

## 2023-12-16 ENCOUNTER — Encounter (HOSPITAL_BASED_OUTPATIENT_CLINIC_OR_DEPARTMENT_OTHER): Payer: Self-pay | Admitting: Obstetrics & Gynecology

## 2023-12-17 ENCOUNTER — Other Ambulatory Visit (HOSPITAL_BASED_OUTPATIENT_CLINIC_OR_DEPARTMENT_OTHER): Payer: Self-pay | Admitting: Obstetrics & Gynecology

## 2023-12-17 DIAGNOSIS — M810 Age-related osteoporosis without current pathological fracture: Secondary | ICD-10-CM

## 2023-12-17 NOTE — Progress Notes (Signed)
 Bone Density scanned in.  DOS 11/15/23 performed at [facility].

## 2023-12-20 ENCOUNTER — Ambulatory Visit: Admitting: Physician Assistant

## 2023-12-20 ENCOUNTER — Encounter: Payer: Self-pay | Admitting: Physician Assistant

## 2023-12-20 DIAGNOSIS — M810 Age-related osteoporosis without current pathological fracture: Secondary | ICD-10-CM | POA: Insufficient documentation

## 2023-12-20 NOTE — Addendum Note (Signed)
 Addended by: RODGERS LACY on: 12/20/2023 09:41 AM   Modules accepted: Orders

## 2023-12-20 NOTE — Progress Notes (Signed)
 Office Visit Note   Patient: Darlene Walker           Date of Birth: May 25, 1961           MRN: [id number] Visit Date: 12/20/2023              Requested by: Cleotilde Ronal RAMAN, MD [address] [place],  [place] [address] PCP: Vernadine Charlie ORN, MD   Assessment & Plan: Visit Diagnoses:  1. Age-related osteoporosis without current pathological fracture     Plan: Darlene Walker is a pleasant 63 year old woman who comes in for referral from Dr. Cleotilde today for osteoporosis.  She recently had a bone density scan which measured osteoporosis with -2.8 at her spine.  She has a BMI of 23.  She has never taken osteoporosis medications in the past.  She has no history of fracture she has no cardiac history.  No history of cancers.  No history of kidney disease.  No history of ulcers.  She does have severe reflux and is medicated for this.  She has no history of seizures.  She was 75 when she went through menopause and was not placed on hormone replacement therapy she is not a smoker and she rarely drinks.  She has met she has not really been doing any exercise.  She was suggested Fosamax but unfortunately with her severe reflux I do not think this would be the appropriate medication.  Given her young age I would recommend especially with her risk for spine compression fractures Tymlos or Evenity.  This could be followed by Reclast or Prolia.  We will draw a vitamin D and some thyroid  labs today.  If she has any abnormalities in her thyroid  she already has an appointment upcoming to see an endocrinologist as she has had a history of some thyroid  issues in the remote past.  We talked about maximizing nontherapeutic means including keeping track of her calcium and measuring her vitamin D.  She is also going to look into put in some strengthening type exercises into her routine.  We talked in detail about the side effects of both Tymlos and Evenity.  Information was given to her.  I spent over 45 minutes  reviewing her labs as well as discussing options and answering her questions.  We will draw some labs today she knows I will contact her if any of them abnormal.  She will contact us  if she would like to go forward with 1 of these therapies  Follow-Up Instructions: Return if symptoms worsen or fail to improve.   Orders:  No orders of the defined types were placed in this encounter.  No orders of the defined types were placed in this encounter.     Procedures: No procedures performed   Clinical Data: No additional findings.   Subjective: No chief complaint on file.   HPI patient is a 63 year old woman who comes in today referred by Dr. Cleotilde for a low bone density scan of -2.8.  She has been diagnosed with osteoporosis  Review of Systems  All other systems reviewed and are negative.    Objective: Vital Signs: LMP 03/30/2011   Physical Exam Constitutional:      Appearance: Normal appearance.  Pulmonary:     Effort: Pulmonary effort is normal.   Skin:    General: Skin is warm and dry.   Neurological:     General: No focal deficit present.     Mental Status: She is alert and oriented  to person, place, and time.   Psychiatric:        Mood and Affect: Mood normal.        Behavior: Behavior normal.      Specialty Comments:  No specialty comments available.  Imaging: No results found.   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   Past Medical History:  Diagnosis Date   Allergy    Alopecia areata    Arthritis    Dysmenorrhea    GERD (gastroesophageal reflux disease)    Graves' disease 1990    Hyperlipidemia    IBS (irritable bowel syndrome)    Leukopenia    Microprolactinoma (HCC)    Pituitary tumor    Spondylolisthesis of lumbar region    Syringomyelia (HCC) 2003   Thyroid  disease    hx of, no meds now    Family History  Problem Relation Age of Onset   Heart disease Mother    Hypertension Father    Heart disease Father    Cancer Sister 60       DCIS, 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, on Tamoxifen, no genetic tesitng   Hyperlipidemia Sister    Hyperlipidemia Sister    Colon cancer Neg Hx    Colon polyps Neg Hx    Esophageal cancer Neg Hx    Rectal cancer Neg Hx    Stomach cancer Neg Hx     Past Surgical History:  Procedure Laterality Date   BUNIONECTOMY  07/2013   left foot   COLONOSCOPY  11/2010   2016   KNEE ARTHROSCOPY W/ MENISCECTOMY Left 03/2018   MENISCUS REPAIR  07/16/2020   MENISCUS REPAIR Left 03/2020   POLYPECTOMY     TONSILLECTOMY AND ADENOIDECTOMY  1968   Social History   Occupational History    Employer: [employer]  Tobacco Use   Smoking status: Never   Smokeless tobacco: Never  Vaping Use   Vaping status: Never Used  Substance and Sexual Activity   Alcohol use: Yes    Alcohol/week: 0.0 - 1.0 standard drinks of alcohol    Comment: occasional   Drug use: No   Sexual activity: Not Currently    Partners: Male    Birth control/protection: Post-menopausal

## 2023-12-21 LAB — VITAMIN D 25 HYDROXY (VIT D DEFICIENCY, FRACTURES): Vit D, 25-Hydroxy: 62 ng/mL (ref 30–100)

## 2023-12-21 LAB — PARATHYROID HORMONE, INTACT (NO CA): PTH: 19 pg/mL (ref 16–77)

## 2023-12-21 LAB — TSH: TSH: 2.44 m[IU]/L (ref 0.40–4.50)

## 2023-12-24 ENCOUNTER — Other Ambulatory Visit (HOSPITAL_BASED_OUTPATIENT_CLINIC_OR_DEPARTMENT_OTHER): Payer: Self-pay | Admitting: *Deleted

## 2023-12-24 DIAGNOSIS — N644 Mastodynia: Secondary | ICD-10-CM

## 2024-01-05 ENCOUNTER — Encounter (HOSPITAL_BASED_OUTPATIENT_CLINIC_OR_DEPARTMENT_OTHER): Payer: Self-pay

## 2024-01-05 ENCOUNTER — Other Ambulatory Visit: Payer: Self-pay

## 2024-01-05 ENCOUNTER — Emergency Department (HOSPITAL_BASED_OUTPATIENT_CLINIC_OR_DEPARTMENT_OTHER)

## 2024-01-05 ENCOUNTER — Emergency Department (HOSPITAL_BASED_OUTPATIENT_CLINIC_OR_DEPARTMENT_OTHER)
Admission: EM | Admit: 2024-01-05 | Discharge: 2024-01-05 | Disposition: A | Discharge status: Home / Self Care | Attending: Emergency Medicine | Admitting: Emergency Medicine

## 2024-01-05 DIAGNOSIS — K529 Noninfective gastroenteritis and colitis, unspecified: Secondary | ICD-10-CM | POA: Diagnosis not present

## 2024-01-05 DIAGNOSIS — R7401 Elevation of levels of liver transaminase levels: Secondary | ICD-10-CM | POA: Diagnosis not present

## 2024-01-05 DIAGNOSIS — R112 Nausea with vomiting, unspecified: Secondary | ICD-10-CM

## 2024-01-05 DIAGNOSIS — R1110 Vomiting, unspecified: Secondary | ICD-10-CM | POA: Diagnosis present

## 2024-01-05 LAB — URINALYSIS, ROUTINE W REFLEX MICROSCOPIC
Bacteria, UA: NONE SEEN
Bilirubin Urine: NEGATIVE
Glucose, UA: NEGATIVE mg/dL
Hgb urine dipstick: NEGATIVE
Ketones, ur: 15 mg/dL — AB
Leukocytes,Ua: NEGATIVE
Nitrite: NEGATIVE
Protein, ur: 30 mg/dL — AB
Specific Gravity, Urine: 1.031 — ABNORMAL HIGH (ref 1.005–1.030)
pH: 6 (ref 5.0–8.0)

## 2024-01-05 LAB — COMPREHENSIVE METABOLIC PANEL WITH GFR
ALT: 41 U/L (ref 0–44)
AST: 45 U/L — ABNORMAL HIGH (ref 15–41)
Albumin: 4.7 g/dL (ref 3.5–5.0)
Alkaline Phosphatase: 81 U/L (ref 38–126)
Anion gap: 12 (ref 5–15)
BUN: 25 mg/dL — ABNORMAL HIGH (ref 8–23)
CO2: 24 mmol/L (ref 22–32)
Calcium: 9.2 mg/dL (ref 8.9–10.3)
Chloride: 103 mmol/L (ref 98–111)
Creatinine, Ser: 0.98 mg/dL (ref 0.44–1.00)
GFR, Estimated: >60 mL/min (ref >60)
Glucose, Bld: 153 mg/dL — ABNORMAL HIGH (ref 70–99)
Potassium: 4 mmol/L (ref 3.5–5.1)
Sodium: 139 mmol/L (ref 135–145)
Total Bilirubin: 0.5 mg/dL (ref 0.0–1.2)
Total Protein: 7.6 g/dL (ref 6.5–8.1)

## 2024-01-05 LAB — CBC
HCT: 40.2 % (ref 36.0–46.0)
Hemoglobin: 13.1 g/dL (ref 12.0–15.0)
MCH: 28.4 pg (ref 26.0–34.0)
MCHC: 32.6 g/dL (ref 30.0–36.0)
MCV: 87 fL (ref 80.0–100.0)
Platelets: 203 K/uL (ref 150–400)
RBC: 4.62 MIL/uL (ref 3.87–5.11)
RDW: 13 % (ref 11.5–15.5)
WBC: 8.4 K/uL (ref 4.0–10.5)
nRBC: 0 % (ref 0.0–0.2)

## 2024-01-05 MED ORDER — ONDANSETRON 8 MG PO TBDP: ORAL_TABLET | ORAL | 0 refills | Status: DC

## 2024-01-05 MED ORDER — ONDANSETRON 4 MG PO TBDP
4.0000 mg | ORAL_TABLET | Freq: Once | ORAL | Status: AC | PRN
  Administered 2024-01-05: 4 mg via ORAL
  Filled 2024-01-05: qty 1

## 2024-01-05 MED ORDER — OXYBUTYNIN CHLORIDE ER 5 MG PO TB24: 5.0000 mg | ORAL_TABLET | Freq: Every day | ORAL | 0 refills | Status: DC

## 2024-01-05 NOTE — ED Provider Notes (Signed)
 [facility]GENC[facility] DEPARTMENT AT [facility] Provider Note   CSN: [id number] Arrival date & time: 01/05/24  0345     Patient presents with: Emesis   Darlene Walker is a 63 y.o. female.   The history is provided by the patient.  Emesis Severity:  Moderate Timing:  Intermittent Quality:  Stomach contents Progression:  Unchanged Chronicity:  New Recent urination:  Normal Context: not post-tussive   Relieved by:  Nothing Worsened by:  Nothing Ineffective treatments:  None tried Associated symptoms: sore throat   Associated symptoms: no abdominal pain, no diarrhea and no fever   Associated symptoms comment:  Had burning with urination, started on Macrobid and then emesis began following first dose  Risk factors: no alcohol use   Patient has had some rhinorrhea and sore throat since the weekend.  She has also had some burning with urination and PMD sent in RX for macrobid.  Patient took her first dose but then had emesis on Monday. No fevers.  No diarrhea     Past Medical History:  Diagnosis Date   Allergy    Alopecia areata    Arthritis    Dysmenorrhea    GERD (gastroesophageal reflux disease)    Graves' disease 1990   Hyperlipidemia    IBS (irritable bowel syndrome)    Leukopenia    Microprolactinoma (HCC)    Pituitary tumor    Spondylolisthesis of lumbar region    Syringomyelia (HCC) 2003   Thyroid  disease    hx of, no meds now     Prior to Admission medications   Medication Sig Start Date End Date Taking? Authorizing Provider  Biotin 10 MG CAPS Take by mouth daily.    [provider]  desvenlafaxine  (PRISTIQ ) 25 MG 24 hr tablet Take 1 tablet (25 mg total) by mouth daily. 08/16/23   Cleotilde Ronal RAMAN, MD  diclofenac (VOLTAREN) 75 MG EC tablet Take 75 mg by mouth as needed. 08/04/21   [provider]  Fezolinetant  (VEOZAH ) 45 MG TABS Take 1 tablet (45 mg total) by mouth daily. 11/17/23   Cleotilde Ronal RAMAN, MD  ondansetron  (ZOFRAN -ODT) 8 MG  disintegrating tablet 8mg  ODT q8 hours prn nausea 01/05/24  Yes Vineeth Fell, MD  oxybutynin  (DITROPAN  XL) 5 MG 24 hr tablet Take 1 tablet (5 mg total) by mouth at bedtime. 01/05/24  Yes Camdynn Maranto, MD  pantoprazole  (PROTONIX ) 40 MG tablet Take 1 tablet (40 mg total) by mouth 2 (two) times daily. 01/05/23   Abran Norleen SAILOR, MD  simvastatin (ZOCOR) 40 MG tablet 1 tablet Daily. 09/23/10   [provider]  Vitamin D , Cholecalciferol, 10 MCG (400 UNIT) TABS Take by mouth.    [provider]    Allergies: Patient has no known allergies.    Review of Systems  Constitutional:  Negative for fever.  HENT:  Positive for rhinorrhea and sore throat.   Gastrointestinal:  Positive for nausea and vomiting. Negative for abdominal pain, constipation and diarrhea.  Genitourinary:  Positive for dysuria.  All other systems reviewed and are negative.   Updated Vital Signs BP (!) 131/93   Pulse 96   Temp (!) 97.3 F (36.3 C) (Temporal)   Resp 20   Ht 5' 6 (1.676 m)   Wt 67.1 kg   LMP 03/30/2011   SpO2 100%   BMI 23.89 kg/m   Physical Exam Vitals and nursing note reviewed. Exam conducted with a chaperone present.  Constitutional:      General: She  is not in acute distress.    Appearance: Normal appearance. She is well-developed.  HENT:     Head: Normocephalic and atraumatic.     Nose: Nose normal.  Eyes:     Pupils: Pupils are equal, round, and reactive to light.  Cardiovascular:     Rate and Rhythm: Normal rate and regular rhythm.     Pulses: Normal pulses.     Heart sounds: Normal heart sounds.  Pulmonary:     Effort: Pulmonary effort is normal. No respiratory distress.     Breath sounds: Normal breath sounds.  Abdominal:     General: Bowel sounds are normal. There is no distension.     Palpations: Abdomen is soft.     Tenderness: There is no abdominal tenderness. There is no guarding or rebound.  Musculoskeletal:        General: Normal range of motion.     Cervical  back: Neck supple.  Skin:    General: Skin is dry.     Capillary Refill: Capillary refill takes less than 2 seconds.     Findings: No erythema or rash.  Neurological:     General: No focal deficit present.     Mental Status: She is alert.     Deep Tendon Reflexes: Reflexes normal.  Psychiatric:        Mood and Affect: Mood normal.     (all labs ordered are listed, but only abnormal results are displayed) Results for orders placed or performed during the hospital encounter of 01/05/24  Comprehensive metabolic panel   Collection Time: 01/05/24  4:16 AM  Result Value Ref Range   Sodium 139 135 - 145 mmol/L   Potassium 4.0 3.5 - 5.1 mmol/L   Chloride 103 98 - 111 mmol/L   CO2 24 22 - 32 mmol/L   Glucose, Bld 153 (H) 70 - 99 mg/dL   BUN 25 (H) 8 - 23 mg/dL   Creatinine, Ser 9.01 0.44 - 1.00 mg/dL   Calcium 9.2 8.9 - 89.6 mg/dL   Total Protein 7.6 6.5 - 8.1 g/dL   Albumin 4.7 3.5 - 5.0 g/dL   AST 45 (H) 15 - 41 U/L   ALT 41 0 - 44 U/L   Alkaline Phosphatase 81 38 - 126 U/L   Total Bilirubin 0.5 0.0 - 1.2 mg/dL   GFR, Estimated >39 >39 mL/min   Anion gap 12 5 - 15  CBC   Collection Time: 01/05/24  4:16 AM  Result Value Ref Range   WBC 8.4 4.0 - 10.5 K/uL   RBC 4.62 3.87 - 5.11 MIL/uL   Hemoglobin 13.1 12.0 - 15.0 g/dL   HCT 59.7 63.9 - 53.9 %   MCV 87.0 80.0 - 100.0 fL   MCH 28.4 26.0 - 34.0 pg   MCHC 32.6 30.0 - 36.0 g/dL   RDW 86.9 88.4 - 84.4 %   Platelets 203 150 - 400 K/uL   nRBC 0.0 0.0 - 0.2 %  Urinalysis, Routine w reflex microscopic -Urine, Clean Catch   Collection Time: 01/05/24  4:16 AM  Result Value Ref Range   Color, Urine YELLOW YELLOW   APPearance CLEAR CLEAR   Specific Gravity, Urine 1.031 (H) 1.005 - 1.030   pH 6.0 5.0 - 8.0   Glucose, UA NEGATIVE NEGATIVE mg/dL   Hgb urine dipstick NEGATIVE NEGATIVE   Bilirubin Urine NEGATIVE NEGATIVE   Ketones, ur 15 (A) NEGATIVE mg/dL   Protein, ur 30 (A) NEGATIVE mg/dL   Nitrite NEGATIVE NEGATIVE  Leukocytes,Ua NEGATIVE NEGATIVE   RBC / HPF 0-5 0 - 5 RBC/hpf   WBC, UA 0-5 0 - 5 WBC/hpf   Bacteria, UA NONE SEEN NONE SEEN   Squamous Epithelial / HPF 0-5 0 - 5 /HPF   Mucus PRESENT    Crystals PRESENT (A) NEGATIVE   CT Renal Stone Study Result Date: 01/05/2024 CLINICAL DATA:  Being treated for urinary tract infection, the patient took her first dose of macrobid and started vomiting. Current symptoms of abdominal and flank pain. EXAM: CT ABDOMEN AND PELVIS WITHOUT CONTRAST TECHNIQUE: Multidetector CT imaging of the abdomen and pelvis was performed following the standard protocol without IV contrast. RADIATION DOSE REDUCTION: This exam was performed according to the departmental dose-optimization program which includes automated exposure control, adjustment of the mA and/or kV according to patient size and/or use of iterative reconstruction technique. COMPARISON:  Limited comparison available with CTA chest 09 July 2021. FINDINGS: Lower chest: Lung bases demonstrate linear scarring or atelectasis without infiltrates. Small hiatal hernia. The cardiac size is normal. Hepatobiliary: The liver is 19.5 cm in length, mildly steatotic. There is a 1.7 cm cyst laterally in segment 8, Hounsfield density is 9. This was seen previously. No follow-up imaging recommended. Remainder of the unenhanced liver is unremarkable. Gallbladder and bile ducts are unremarkable. Pancreas: Unremarkable without contrast. No pancreatic ductal dilatation or surrounding inflammatory changes. Spleen: Normal in size without focal abnormality. Adrenals/Urinary Tract: There is no adrenal mass. There is no contour deforming abnormality of the unenhanced kidneys. There is no urinary stone or obstruction. No perinephric fluid or edema. Bladder is contracted and not well seen but there are no inflammatory changes around it. Stomach/Bowel: Unremarkable partially contracted stomach. Normal caliber unopacified small bowel with nondilated fluid  filling. This may be seen with intestinal ileus or enteritis. The appendix is normal caliber. There is fluid throughout the ascending and transverse colon without wall thickening or inflammation, mild solid stool in the remainder. Vascular/Lymphatic: Aortic atherosclerosis. No enlarged abdominal or pelvic lymph nodes. Reproductive: Status post hysterectomy. No adnexal masses. Other: Small umbilical fat hernia. No incarcerated hernia. No free fluid or free air. Musculoskeletal: There are degenerative changes of the lumbar discs and facet joints with prominent facet osteophytes at L3-4 and L4-5 associated with grade 1 spondylolisthesis both levels. No acute or other significant osseous findings. IMPRESSION: 1. No urinary stone or obstruction. 2. Fluid in nondilated small bowel and ascending and transverse colon which may be seen with ileus or enteritis. No mesenteric inflammatory changes or wall thickening. 3. Small hiatal hernia.  Small umbilical fat hernia. 4. Aortic atherosclerosis. 5. Lumbar degenerative changes with grade 1 spondylolisthesis L3-4 and L4-5. Aortic Atherosclerosis (ICD10-I70.0). Electronically Signed   By: Francis Quam M.D.   On: 01/05/2024 05:03    Radiology: CT Renal Stone Study Result Date: 01/05/2024 CLINICAL DATA:  Being treated for urinary tract infection, the patient took her first dose of macrobid and started vomiting. Current symptoms of abdominal and flank pain. EXAM: CT ABDOMEN AND PELVIS WITHOUT CONTRAST TECHNIQUE: Multidetector CT imaging of the abdomen and pelvis was performed following the standard protocol without IV contrast. RADIATION DOSE REDUCTION: This exam was performed according to the departmental dose-optimization program which includes automated exposure control, adjustment of the mA and/or kV according to patient size and/or use of iterative reconstruction technique. COMPARISON:  Limited comparison available with CTA chest 09 July 2021. FINDINGS: Lower chest:  Lung bases demonstrate linear scarring or atelectasis without infiltrates. Small hiatal hernia. The cardiac size  is normal. Hepatobiliary: The liver is 19.5 cm in length, mildly steatotic. There is a 1.7 cm cyst laterally in segment 8, Hounsfield density is 9. This was seen previously. No follow-up imaging recommended. Remainder of the unenhanced liver is unremarkable. Gallbladder and bile ducts are unremarkable. Pancreas: Unremarkable without contrast. No pancreatic ductal dilatation or surrounding inflammatory changes. Spleen: Normal in size without focal abnormality. Adrenals/Urinary Tract: There is no adrenal mass. There is no contour deforming abnormality of the unenhanced kidneys. There is no urinary stone or obstruction. No perinephric fluid or edema. Bladder is contracted and not well seen but there are no inflammatory changes around it. Stomach/Bowel: Unremarkable partially contracted stomach. Normal caliber unopacified small bowel with nondilated fluid filling. This may be seen with intestinal ileus or enteritis. The appendix is normal caliber. There is fluid throughout the ascending and transverse colon without wall thickening or inflammation, mild solid stool in the remainder. Vascular/Lymphatic: Aortic atherosclerosis. No enlarged abdominal or pelvic lymph nodes. Reproductive: Status post hysterectomy. No adnexal masses. Other: Small umbilical fat hernia. No incarcerated hernia. No free fluid or free air. Musculoskeletal: There are degenerative changes of the lumbar discs and facet joints with prominent facet osteophytes at L3-4 and L4-5 associated with grade 1 spondylolisthesis both levels. No acute or other significant osseous findings. IMPRESSION: 1. No urinary stone or obstruction. 2. Fluid in nondilated small bowel and ascending and transverse colon which may be seen with ileus or enteritis. No mesenteric inflammatory changes or wall thickening. 3. Small hiatal hernia.  Small umbilical fat hernia.  4. Aortic atherosclerosis. 5. Lumbar degenerative changes with grade 1 spondylolisthesis L3-4 and L4-5. Aortic Atherosclerosis (ICD10-I70.0). Electronically Signed   By: Francis Quam M.D.   On: 01/05/2024 05:03     Procedures   Medications Ordered in the ED  ondansetron  (ZOFRAN -ODT) disintegrating tablet 4 mg (4 mg Oral Given 01/05/24 0410)                                    Medical Decision Making Amount and/or Complexity of Data Reviewed External Data Reviewed: notes.    Details: Previous notes reviewed  Labs: ordered.    Details: Urine is negative for UTI.  Normal white count 8.4, normal hemoglobin 13.1, normal platelets.  Normal sodium 139, normal potassium 4.0. norma creatinine AST 45 slight elevation  Radiology: ordered and independent interpretation performed.    Details: No stones by me on CT  Risk Prescription drug management. Risk Details: Patient informed of slight elevation of AST as she is on Veozah  and this will need to be monitored. I have advised patient to let her PMD know this so it may be rechecked.  She verbalizes understanding of this and will contact her PMD.  Will initiate ditropan  as this may be spasm causing pain as patient doesn't have a UTI nor cystitis on CT.  There is liquid stool in bowel on CT and the entire constellation of symptoms including sore throat and rhinorrhea and emesis may be due to a viral etiology.  RX for zofran  ODT as this will help with nausea and as it is constipating it will slow any potential diarrhea.  No further emesis in the ED.  Stable for discharge.  Strict returns.      Final diagnoses:  Nausea and vomiting, unspecified vomiting type  Enteritis  Elevated AST (SGOT)   No signs of systemic illness or infection. The patient is nontoxic-appearing  on exam and vital signs are within normal limits.  I have reviewed the triage vital signs and the nursing notes. Pertinent labs & imaging results that were available during my care of  the patient were reviewed by me and considered in my medical decision making (see chart for details). After history, exam, and medical workup I feel the patient has been appropriately medically screened and is safe for discharge home. Pertinent diagnoses were discussed with the patient. Patient was given return precautions.    ED Discharge Orders          Ordered    ondansetron  (ZOFRAN -ODT) 8 MG disintegrating tablet        01/05/24 0508    oxybutynin  (DITROPAN  XL) 5 MG 24 hr tablet  Daily at bedtime        01/05/24 0508               Keara Pagliarulo, MD 01/05/24 [id number]

## 2024-01-05 NOTE — ED Triage Notes (Signed)
 Pt POV with friend d/r vomiting after starting Macrobid - taking for a UTI.  Pt had burning and frequency so PCP prescribed without seeing her because she was out of town.  Pt only had 1 dose on Monday night.

## 2024-01-07 ENCOUNTER — Encounter (HOSPITAL_BASED_OUTPATIENT_CLINIC_OR_DEPARTMENT_OTHER): Payer: Self-pay | Admitting: Obstetrics & Gynecology

## 2024-01-08 ENCOUNTER — Other Ambulatory Visit (HOSPITAL_BASED_OUTPATIENT_CLINIC_OR_DEPARTMENT_OTHER): Payer: Self-pay | Admitting: Obstetrics & Gynecology

## 2024-01-08 DIAGNOSIS — R748 Abnormal levels of other serum enzymes: Secondary | ICD-10-CM

## 2024-01-17 ENCOUNTER — Encounter (HOSPITAL_BASED_OUTPATIENT_CLINIC_OR_DEPARTMENT_OTHER): Payer: Self-pay | Admitting: Obstetrics & Gynecology

## 2024-01-17 ENCOUNTER — Other Ambulatory Visit (HOSPITAL_BASED_OUTPATIENT_CLINIC_OR_DEPARTMENT_OTHER): Payer: Self-pay | Admitting: Obstetrics & Gynecology

## 2024-01-17 ENCOUNTER — Other Ambulatory Visit (HOSPITAL_BASED_OUTPATIENT_CLINIC_OR_DEPARTMENT_OTHER): Payer: Self-pay

## 2024-01-17 DIAGNOSIS — Z79899 Other long term (current) drug therapy: Secondary | ICD-10-CM

## 2024-01-17 DIAGNOSIS — R748 Abnormal levels of other serum enzymes: Secondary | ICD-10-CM

## 2024-01-17 LAB — COMPREHENSIVE METABOLIC PANEL WITH GFR
ALT: 24 IU/L (ref 0–32)
AST: 24 IU/L (ref 0–40)
Albumin: 4.5 g/dL (ref 3.9–4.9)
Alkaline Phosphatase: 74 IU/L (ref 44–121)
BUN/Creatinine Ratio: 16 (ref 12–28)
BUN: 15 mg/dL (ref 8–27)
Bilirubin Total: 0.3 mg/dL (ref 0.0–1.2)
CO2: 19 mmol/L — ABNORMAL LOW (ref 20–29)
Calcium: 9.4 mg/dL (ref 8.7–10.3)
Chloride: 104 mmol/L (ref 96–106)
Creatinine, Ser: 0.92 mg/dL (ref 0.57–1.00)
Globulin, Total: 2.4 g/dL (ref 1.5–4.5)
Glucose: 77 mg/dL (ref 70–99)
Potassium: 4.1 mmol/L (ref 3.5–5.2)
Sodium: 139 mmol/L (ref 134–144)
Total Protein: 6.9 g/dL (ref 6.0–8.5)
eGFR: 70 mL/min/1.73 (ref >59)

## 2024-01-20 ENCOUNTER — Other Ambulatory Visit (HOSPITAL_BASED_OUTPATIENT_CLINIC_OR_DEPARTMENT_OTHER): Payer: Self-pay | Admitting: Obstetrics & Gynecology

## 2024-01-20 ENCOUNTER — Ambulatory Visit (HOSPITAL_BASED_OUTPATIENT_CLINIC_OR_DEPARTMENT_OTHER): Payer: Self-pay | Admitting: Obstetrics & Gynecology

## 2024-01-20 DIAGNOSIS — M810 Age-related osteoporosis without current pathological fracture: Secondary | ICD-10-CM

## 2024-01-22 ENCOUNTER — Other Ambulatory Visit (HOSPITAL_BASED_OUTPATIENT_CLINIC_OR_DEPARTMENT_OTHER): Payer: Self-pay | Admitting: Obstetrics & Gynecology

## 2024-01-22 DIAGNOSIS — R748 Abnormal levels of other serum enzymes: Secondary | ICD-10-CM

## 2024-02-09 ENCOUNTER — Other Ambulatory Visit (HOSPITAL_BASED_OUTPATIENT_CLINIC_OR_DEPARTMENT_OTHER)

## 2024-02-15 ENCOUNTER — Other Ambulatory Visit (HOSPITAL_BASED_OUTPATIENT_CLINIC_OR_DEPARTMENT_OTHER): Payer: Self-pay

## 2024-02-15 DIAGNOSIS — R748 Abnormal levels of other serum enzymes: Secondary | ICD-10-CM

## 2024-02-16 ENCOUNTER — Ambulatory Visit (HOSPITAL_BASED_OUTPATIENT_CLINIC_OR_DEPARTMENT_OTHER): Payer: Self-pay | Admitting: Obstetrics & Gynecology

## 2024-02-16 ENCOUNTER — Other Ambulatory Visit (HOSPITAL_BASED_OUTPATIENT_CLINIC_OR_DEPARTMENT_OTHER)

## 2024-02-16 ENCOUNTER — Other Ambulatory Visit (HOSPITAL_BASED_OUTPATIENT_CLINIC_OR_DEPARTMENT_OTHER): Payer: Self-pay | Admitting: Obstetrics & Gynecology

## 2024-02-16 DIAGNOSIS — R232 Flushing: Secondary | ICD-10-CM

## 2024-02-16 DIAGNOSIS — Z79899 Other long term (current) drug therapy: Secondary | ICD-10-CM

## 2024-02-16 DIAGNOSIS — R748 Abnormal levels of other serum enzymes: Secondary | ICD-10-CM

## 2024-02-16 LAB — HEPATIC FUNCTION PANEL
ALT: 26 IU/L (ref 0–32)
AST: 24 IU/L (ref 0–40)
Albumin: 4.4 g/dL (ref 3.9–4.9)
Alkaline Phosphatase: 76 IU/L (ref 44–121)
Bilirubin Total: 0.3 mg/dL (ref 0.0–1.2)
Bilirubin, Direct: 0.11 mg/dL (ref 0.00–0.40)
Total Protein: 6.7 g/dL (ref 6.0–8.5)

## 2024-02-16 MED ORDER — VEOZAH 45 MG PO TABS: 1.0000 | ORAL_TABLET | Freq: Every day | ORAL | 1 refills | Status: DC

## 2024-02-24 ENCOUNTER — Other Ambulatory Visit (HOSPITAL_BASED_OUTPATIENT_CLINIC_OR_DEPARTMENT_OTHER): Payer: Self-pay | Admitting: Obstetrics & Gynecology

## 2024-02-24 DIAGNOSIS — Z79899 Other long term (current) drug therapy: Secondary | ICD-10-CM

## 2024-03-06 ENCOUNTER — Other Ambulatory Visit (HOSPITAL_BASED_OUTPATIENT_CLINIC_OR_DEPARTMENT_OTHER): Payer: Self-pay | Admitting: Obstetrics & Gynecology

## 2024-03-06 ENCOUNTER — Encounter (HOSPITAL_BASED_OUTPATIENT_CLINIC_OR_DEPARTMENT_OTHER): Payer: Self-pay | Admitting: Obstetrics & Gynecology

## 2024-03-06 DIAGNOSIS — Z79899 Other long term (current) drug therapy: Secondary | ICD-10-CM

## 2024-03-06 DIAGNOSIS — R748 Abnormal levels of other serum enzymes: Secondary | ICD-10-CM

## 2024-03-10 ENCOUNTER — Encounter (HOSPITAL_BASED_OUTPATIENT_CLINIC_OR_DEPARTMENT_OTHER): Payer: Self-pay | Admitting: Obstetrics & Gynecology

## 2024-03-30 LAB — HEPATIC FUNCTION PANEL
ALT: 20 IU/L (ref 0–32)
AST: 25 IU/L (ref 0–40)
Albumin: 4.2 g/dL (ref 3.9–4.9)
Alkaline Phosphatase: 71 IU/L (ref 49–135)
Bilirubin Total: 0.3 mg/dL (ref 0.0–1.2)
Bilirubin, Direct: 0.08 mg/dL (ref 0.00–0.40)
Total Protein: 6.6 g/dL (ref 6.0–8.5)

## 2024-04-03 ENCOUNTER — Ambulatory Visit (HOSPITAL_BASED_OUTPATIENT_CLINIC_OR_DEPARTMENT_OTHER): Payer: Self-pay | Admitting: Obstetrics & Gynecology

## 2024-04-03 ENCOUNTER — Other Ambulatory Visit (HOSPITAL_BASED_OUTPATIENT_CLINIC_OR_DEPARTMENT_OTHER): Payer: Self-pay | Admitting: Obstetrics & Gynecology

## 2024-04-03 DIAGNOSIS — R232 Flushing: Secondary | ICD-10-CM

## 2024-04-03 DIAGNOSIS — Z79899 Other long term (current) drug therapy: Secondary | ICD-10-CM

## 2024-04-03 MED ORDER — VEOZAH 45 MG PO TABS
1.0000 | ORAL_TABLET | Freq: Every day | ORAL | 0 refills | Status: DC
Start: 2024-04-03 — End: 2024-05-12

## 2024-05-01 ENCOUNTER — Encounter: Payer: Self-pay | Admitting: Radiology

## 2024-05-02 ENCOUNTER — Ambulatory Visit: Admission: RE | Admit: 2024-05-02 | Source: Home / Self Care | Admitting: Gastroenterology

## 2024-05-02 ENCOUNTER — Encounter: Admission: RE | Payer: Self-pay | Source: Home / Self Care

## 2024-05-02 SURGERY — COLONOSCOPY
Anesthesia: General

## 2024-05-09 LAB — HEPATIC FUNCTION PANEL
ALT: 16 IU/L (ref 0–32)
AST: 21 IU/L (ref 0–40)
Albumin: 4.4 g/dL (ref 3.9–4.9)
Alkaline Phosphatase: 94 IU/L (ref 49–135)
Bilirubin Total: 0.2 mg/dL (ref 0.0–1.2)
Bilirubin, Direct: 0.09 mg/dL (ref 0.00–0.40)
Total Protein: 6.5 g/dL (ref 6.0–8.5)

## 2024-05-12 ENCOUNTER — Other Ambulatory Visit (HOSPITAL_BASED_OUTPATIENT_CLINIC_OR_DEPARTMENT_OTHER): Payer: Self-pay | Admitting: Obstetrics & Gynecology

## 2024-05-12 DIAGNOSIS — R232 Flushing: Secondary | ICD-10-CM

## 2024-05-12 MED ORDER — VEOZAH 45 MG PO TABS: 1.0000 | ORAL_TABLET | Freq: Every day | ORAL | 0 refills | Status: AC

## 2024-05-16 ENCOUNTER — Other Ambulatory Visit (HOSPITAL_BASED_OUTPATIENT_CLINIC_OR_DEPARTMENT_OTHER): Payer: Self-pay | Admitting: Obstetrics & Gynecology

## 2024-05-16 ENCOUNTER — Other Ambulatory Visit (HOSPITAL_BASED_OUTPATIENT_CLINIC_OR_DEPARTMENT_OTHER): Payer: Self-pay

## 2024-05-16 DIAGNOSIS — R232 Flushing: Secondary | ICD-10-CM

## 2024-05-16 MED ORDER — DESVENLAFAXINE SUCCINATE ER 25 MG PO TB24: 25.0000 mg | ORAL_TABLET | Freq: Every day | ORAL | 0 refills | Status: AC

## 2024-06-12 ENCOUNTER — Other Ambulatory Visit (HOSPITAL_COMMUNITY): Payer: Self-pay | Admitting: Oral and Maxillofacial Surgery

## 2024-06-12 DIAGNOSIS — G939 Disorder of brain, unspecified: Secondary | ICD-10-CM

## 2024-06-19 ENCOUNTER — Ambulatory Visit (HOSPITAL_COMMUNITY)
Admission: RE | Admit: 2024-06-19 | Discharge: 2024-06-19 | Disposition: A | Discharge status: Home / Self Care | Source: Ambulatory Visit | Attending: Oral and Maxillofacial Surgery | Admitting: Oral and Maxillofacial Surgery

## 2024-06-19 DIAGNOSIS — G939 Disorder of brain, unspecified: Secondary | ICD-10-CM | POA: Insufficient documentation

## 2024-06-27 ENCOUNTER — Other Ambulatory Visit (HOSPITAL_COMMUNITY): Payer: Self-pay | Admitting: Oral and Maxillofacial Surgery

## 2024-06-27 DIAGNOSIS — G939 Disorder of brain, unspecified: Secondary | ICD-10-CM

## 2024-06-28 ENCOUNTER — Ambulatory Visit (HOSPITAL_COMMUNITY)
Admission: RE | Admit: 2024-06-28 | Discharge: 2024-06-28 | Disposition: A | Discharge status: Home / Self Care | Source: Ambulatory Visit | Attending: Oral and Maxillofacial Surgery | Admitting: Oral and Maxillofacial Surgery

## 2024-06-28 ENCOUNTER — Other Ambulatory Visit (HOSPITAL_COMMUNITY): Payer: Self-pay | Admitting: Oral and Maxillofacial Surgery

## 2024-06-28 DIAGNOSIS — G939 Disorder of brain, unspecified: Secondary | ICD-10-CM | POA: Diagnosis present

## 2024-06-28 MED ORDER — IOHEXOL 300 MG/ML  SOLN
50.0000 mL | Freq: Once | INTRAMUSCULAR | Status: AC | PRN
  Administered 2024-06-28: 50 mL via INTRAVENOUS

## 2024-06-28 MED ORDER — IOHEXOL 300 MG/ML  SOLN
75.0000 mL | Freq: Once | INTRAMUSCULAR | Status: AC | PRN
  Administered 2024-06-28: 75 mL via INTRAVENOUS

## 2024-08-25 ENCOUNTER — Ambulatory Visit: Admitting: Cardiology

## 2024-09-06 ENCOUNTER — Ambulatory Visit (HOSPITAL_BASED_OUTPATIENT_CLINIC_OR_DEPARTMENT_OTHER): Admitting: Obstetrics & Gynecology
